# Patient Record
Sex: Male | Born: 1975 | Race: White | Hispanic: No | Marital: Married | Smoking: Former smoker
Health system: Southern US, Community
[De-identification: ages and names within clinical notes are randomized; demographics above are authoritative.]

## PROBLEM LIST (undated history)

## (undated) DIAGNOSIS — S46009A Unspecified injury of muscle(s) and tendon(s) of the rotator cuff of unspecified shoulder, initial encounter: Secondary | ICD-10-CM

## (undated) DIAGNOSIS — D649 Anemia, unspecified: Secondary | ICD-10-CM

## (undated) DIAGNOSIS — E785 Hyperlipidemia, unspecified: Secondary | ICD-10-CM

## (undated) DIAGNOSIS — R55 Syncope and collapse: Secondary | ICD-10-CM

## (undated) DIAGNOSIS — K429 Umbilical hernia without obstruction or gangrene: Secondary | ICD-10-CM

## (undated) HISTORY — DX: Unspecified injury of muscle(s) and tendon(s) of the rotator cuff of unspecified shoulder, initial encounter: S46.009A

## (undated) HISTORY — DX: Anemia, unspecified: D64.9

## (undated) HISTORY — PX: REFRACTIVE SURGERY: SHX103

## (undated) HISTORY — DX: Hyperlipidemia, unspecified: E78.5

## (undated) HISTORY — DX: Umbilical hernia without obstruction or gangrene: K42.9

---
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
## 2010-04-16 ENCOUNTER — Ambulatory Visit: Payer: Self-pay | Admitting: Internal Medicine

## 2010-04-30 DIAGNOSIS — M719 Bursopathy, unspecified: Secondary | ICD-10-CM

## 2010-04-30 DIAGNOSIS — M67919 Unspecified disorder of synovium and tendon, unspecified shoulder: Secondary | ICD-10-CM | POA: Insufficient documentation

## 2010-04-30 DIAGNOSIS — E785 Hyperlipidemia, unspecified: Secondary | ICD-10-CM | POA: Insufficient documentation

## 2010-07-10 ENCOUNTER — Ambulatory Visit: Payer: Self-pay | Admitting: Internal Medicine

## 2010-07-10 LAB — CONVERTED CEMR LAB
Albumin: 4.7 g/dL (ref 3.5–5.2)
BUN: 18 mg/dL (ref 6–23)
Basophils Relative: 0.5 % (ref 0.0–3.0)
Bilirubin Urine: NEGATIVE
Bilirubin, Direct: 0.2 mg/dL (ref 0.0–0.3)
CO2: 28 meq/L (ref 19–32)
Chloride: 104 meq/L (ref 96–112)
Cholesterol: 192 mg/dL (ref 0–200)
Creatinine, Ser: 1.3 mg/dL (ref 0.4–1.5)
Eosinophils Absolute: 0.2 10*3/uL (ref 0.0–0.7)
Glucose, Urine, Semiquant: NEGATIVE
Ketones, urine, test strip: NEGATIVE
MCHC: 35.3 g/dL (ref 30.0–36.0)
MCV: 89.5 fL (ref 78.0–100.0)
Monocytes Absolute: 0.4 10*3/uL (ref 0.1–1.0)
Neutrophils Relative %: 53.4 % (ref 43.0–77.0)
RBC: 4.38 M/uL (ref 4.22–5.81)
Specific Gravity, Urine: 1.01
TSH: 3.62 microintl units/mL (ref 0.35–5.50)
Total CHOL/HDL Ratio: 5
Total Protein: 7.5 g/dL (ref 6.0–8.3)
Triglycerides: 143 mg/dL (ref 0.0–149.0)
pH: 6

## 2010-07-21 ENCOUNTER — Encounter: Payer: Self-pay | Admitting: Internal Medicine

## 2010-07-21 ENCOUNTER — Ambulatory Visit: Payer: Self-pay | Admitting: Internal Medicine

## 2010-09-01 NOTE — Assessment & Plan Note (Signed)
Summary: TO BE EST/NJR OK PER DOC/NJR   Vital Signs:  Patient profile:   35 year old male Height:      73.5 inches Weight:      216 pounds BMI:     28.21 Temp:     98.2 degrees F oral Pulse rate:   60 / minute Resp:     14 per minute BP sitting:   110 / 70  (left arm)  Vitals Entered By: Willy Eddy, LPN (April 16, 2010 11:05 AM) CC: new patient Is Patient Diabetic? No   CC:  new patient.  History of Present Illness: shoulder pain in right rotator cuff  Has pain in the left butock that sometime radiates to the left thigh... some times goes past the knee has felt some back pain with work outs  new to establish and has significant familu hx of hyperlipidemia has not had lipids checked recentlhy has lost 20 pounds through diet and exercize    Preventive Screening-Counseling & Management  Alcohol-Tobacco     Alcohol drinks/day: <1     Smoking Status: quit     Year Quit: 2007     Tobacco Counseling: to remain off tobacco products  Caffeine-Diet-Exercise     Caffeine use/day: no     Does Patient Exercise: yes     Type of exercise: gym daily     Times/week: 7     Exercise Counseling: not indicated; exercise is adequate      Drug Use:  no.    Problems Prior to Update: None  Current Problems (verified): None  Medications Prior to Update: 1)  None  Current Medications (verified): 1)  None  Allergies (verified): No Known Drug Allergies  Past History:  Family History: Last updated: 04/16/2010 Family History Hypertension Family History High cholesterol no family hx of anyursm  Social History: Last updated: 04/16/2010 Occupation:social workerl  Single   Alcohol use-yes Drug use-no Regular exercise-yes  Risk Factors: Alcohol Use: <1 (04/16/2010) Caffeine Use: no (04/16/2010) Exercise: yes (04/16/2010)  Risk Factors: Smoking Status: quit (04/16/2010)  Past medical, surgical, family and social histories (including risk factors)  reviewed, and no changes noted (except as noted below).  Past Medical History: Unremarkable rotator cuff injusry on the right ( no surgery)  Past Surgical History: Appendectomy 1995  Family History: Reviewed history and no changes required. Family History Hypertension Family History High cholesterol no family hx of anyursm  Social History: Reviewed history and no changes required. Occupation:social workerl  Single   Alcohol use-yes Drug use-no Regular exercise-yes Drug Use:  no Does Patient Exercise:  yes Smoking Status:  quit Caffeine use/day:  no  Review of Systems  The patient denies anorexia, fever, weight loss, weight gain, vision loss, decreased hearing, hoarseness, chest pain, syncope, dyspnea on exertion, peripheral edema, prolonged cough, headaches, hemoptysis, abdominal pain, melena, hematochezia, severe indigestion/heartburn, hematuria, incontinence, genital sores, muscle weakness, suspicious skin lesions, transient blindness, difficulty walking, depression, unusual weight change, abnormal bleeding, enlarged lymph nodes, angioedema, breast masses, and testicular masses.    Physical Exam  General:  Well-developed,well-nourished,in no acute distress; alert,appropriate and cooperative throughout examination Head:  normocephalic and atraumatic.   Eyes:  pupils equal and pupils round.   Ears:  R ear normal and L ear normal.   Nose:  no external deformity and no nasal discharge.   Neck:  No deformities, masses, or tenderness noted. Chest Wall:  no deformities and no tenderness.   Breasts:  no gynecomastia and no adenopathy.  Lungs:  no wheezes.   Heart:  normal rate and regular rhythm.   Psych:  Oriented X3 and normally interactive.     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   cpx   Immunization History:  Tetanus/Td Immunization History:    Tetanus/Td:  historical (03/01/2006)  Hepatitis B Immunization History:    Hepatitis B # 1:  historical (07/02/2006)    Hepatitis B # 2:  historical (09/30/2005)    Hepatitis B # 3:  historical (04/08/2007)

## 2010-09-03 NOTE — Assessment & Plan Note (Signed)
Summary: CPX//ALP/PT RSC FROM BMP/OK PER DR/CJR   Vital Signs:  Patient profile:   35 year old male Height:      73.5 inches Weight:      224 pounds BMI:     29.26 Temp:     98.2 degrees F oral Pulse rate:   56 / minute Resp:     14 per minute BP sitting:   124 / 76  (left arm)  Vitals Entered By: Willy Eddy, LPN (July 21, 2010 3:49 PM) CC: cpx Is Patient Diabetic? No   Primary Care Shauntay Brunelli:  Stacie Glaze MD  CC:  cpx.  History of Present Illness: The pt was asked about all immunizations, health maint. 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) 2)  Rotator Cuff Syndrome, Right  (ICD-726.10)  Medications Prior to Update: 1)  None  Current Medications (verified): 1)  None  Allergies (verified): No Known Drug Allergies  Past History:  Family History: Last updated: 04/16/2010 Family History Hypertension Family History High cholesterol no family hx of anyursm  Social History: Last updated: 04/16/2010 Occupation:social workerl  Single   Alcohol use-yes Drug use-no Regular exercise-yes  Risk Factors: Alcohol Use: <1 (07/21/2010) Caffeine Use: no (04/16/2010) Exercise: yes (04/16/2010)  Risk Factors: Smoking Status: quit (07/21/2010)  Past medical, surgical, family and social histories (including risk factors) reviewed, and no changes noted (except as noted below).  Past Medical History: Reviewed history from 04/16/2010 and no changes  required. Unremarkable rotator cuff injusry on the right ( no surgery)  Past Surgical History: Reviewed history from 04/16/2010 and no changes required. Appendectomy 1995  Family History: Reviewed history from 04/16/2010 and no changes required. Family History Hypertension Family History High cholesterol no family hx of anyursm  Social History: Reviewed history from 04/16/2010 and no changes required. Occupation:social workerl  Single   Alcohol use-yes Drug use-no Regular exercise-yes  Review of Systems  The patient denies anorexia, fever, weight loss, weight gain, vision loss, decreased hearing, hoarseness, chest pain, syncope, dyspnea on exertion, peripheral edema, prolonged cough, headaches, hemoptysis, abdominal pain, melena, hematochezia, severe indigestion/heartburn, hematuria, incontinence, genital sores, muscle weakness, suspicious skin lesions, transient blindness, difficulty walking, depression, unusual weight change, abnormal bleeding, enlarged lymph nodes, angioedema, breast masses, and testicular masses.    Physical Exam  General:  Well-developed,well-nourished,in no acute distress; alert,appropriate and cooperative throughout examination Head:  normocephalic and atraumatic.   Eyes:  pupils equal and pupils round.   Ears:  R ear normal and L ear normal.   Nose:  no external deformity and no nasal discharge.   Neck:  No deformities, masses, or tenderness noted. Lungs:  no wheezes.   Heart:  normal rate and regular rhythm.   Abdomen:  soft, non-tender, normal bowel sounds, and no distention.   Rectal:  no external abnormalities and no masses.   Genitalia:  circumcised and no urethral discharge.   Prostate:  no gland enlargement and no asymmetry.   Pulses:  R and  L carotid,radial,femoral,dorsalis pedis and posterior tibial pulses are full and equal bilaterally Extremities:  No clubbing, cyanosis, edema, or deformity noted with normal full range of motion of all  joints.   Neurologic:  No cranial nerve deficits noted. Station and gait are normal. Plantar reflexes are down-going bilaterally. DTRs are symmetrical throughout. Sensory, motor and coordinative functions appear intact.   Impression & Recommendations:  Problem # 1:  HEALTH MAINTENANCE EXAM (ICD-V70.0) The pt was asked about all immunizations, health maint. services that are appropriate to their age and was given guidance on diet exercize  and weight management  Orders: EKG w/ Interpretation (93000)  Td Booster: Historical (03/01/2006)   Chol: 192 (07/10/2010)   HDL: 39.80 (07/10/2010)   LDL: 124 (07/10/2010)   TG: 143.0 (07/10/2010) TSH: 3.62 (07/10/2010)    Discussed using sunscreen, use of alcohol, drug use, self testicular exam, routine dental care, routine eye care, routine physical exam, seat belts, multiple vitamins, osteoporosis prevention, adequate calcium intake in diet, and recommendations for immunizations.  Discussed exercise and checking cholesterol.  Discussed gun safety, safe sex, and contraception. Also recommend checking PSA.  Patient Instructions: 1)  Please schedule a follow-up appointment in 1 year. for cpx   Orders Added: 1)  EKG w/ Interpretation [93000] 2)  Est. Patient 18-39 years [11914]

## 2011-08-05 ENCOUNTER — Other Ambulatory Visit (INDEPENDENT_AMBULATORY_CARE_PROVIDER_SITE_OTHER): Payer: BC Managed Care – PPO

## 2011-08-05 DIAGNOSIS — Z0000 Encounter for general adult medical examination without abnormal findings: Secondary | ICD-10-CM

## 2011-08-05 LAB — BASIC METABOLIC PANEL
GFR: 71.9 mL/min (ref >60.00)
Glucose, Bld: 81 mg/dL (ref 70–99)
Potassium: 3.9 mEq/L (ref 3.5–5.1)
Sodium: 141 mEq/L (ref 135–145)

## 2011-08-05 LAB — CBC WITH DIFFERENTIAL/PLATELET
Eosinophils Relative: 3.6 % (ref 0.0–5.0)
HCT: 38.1 % — ABNORMAL LOW (ref 39.0–52.0)
Hemoglobin: 13.4 g/dL (ref 13.0–17.0)
Lymphs Abs: 1.7 10*3/uL (ref 0.7–4.0)
Monocytes Relative: 8.1 % (ref 3.0–12.0)
Neutro Abs: 2.7 10*3/uL (ref 1.4–7.7)
RBC: 4.3 Mil/uL (ref 4.22–5.81)
WBC: 5 10*3/uL (ref 4.5–10.5)

## 2011-08-05 LAB — POCT URINALYSIS DIPSTICK
Bilirubin, UA: NEGATIVE
Blood, UA: NEGATIVE
Glucose, UA: NEGATIVE
Nitrite, UA: NEGATIVE
Spec Grav, UA: <=1.005

## 2011-08-05 LAB — HEPATIC FUNCTION PANEL
ALT: 56 U/L — ABNORMAL HIGH (ref 0–53)
AST: 56 U/L — ABNORMAL HIGH (ref 0–37)
Albumin: 4.6 g/dL (ref 3.5–5.2)
Alkaline Phosphatase: 59 U/L (ref 39–117)

## 2011-08-05 LAB — TSH: TSH: 3.29 u[IU]/mL (ref 0.35–5.50)

## 2011-08-05 LAB — LIPID PANEL
Cholesterol: 224 mg/dL — ABNORMAL HIGH (ref 0–200)
Total CHOL/HDL Ratio: 5

## 2011-08-05 LAB — LDL CHOLESTEROL, DIRECT: Direct LDL: 141 mg/dL

## 2011-08-12 ENCOUNTER — Encounter: Payer: Self-pay | Admitting: Internal Medicine

## 2011-08-12 ENCOUNTER — Ambulatory Visit (INDEPENDENT_AMBULATORY_CARE_PROVIDER_SITE_OTHER): Payer: BC Managed Care – PPO | Admitting: Internal Medicine

## 2011-08-12 DIAGNOSIS — M25522 Pain in left elbow: Secondary | ICD-10-CM

## 2011-08-12 DIAGNOSIS — M25529 Pain in unspecified elbow: Secondary | ICD-10-CM

## 2011-08-12 DIAGNOSIS — E785 Hyperlipidemia, unspecified: Secondary | ICD-10-CM

## 2011-08-12 DIAGNOSIS — Z0000 Encounter for general adult medical examination without abnormal findings: Secondary | ICD-10-CM

## 2011-08-12 NOTE — Progress Notes (Signed)
Subjective:    Patient ID: Thomas Gilmore, male    DOB: [birth year], 36 y.o.   MRN: [id number]  HPI Weight and exercize Muscle gain in mass Cardio is the same Has been eating an increased caloric diet due to his desire to gain weight and this has resulted in an imbalance of his cholesterol and elevated liver functions.  Patient also has an acute complaint of pain in his left elbow he states that it was after he fell asleep with his an extended position and he still has some discomfort but feels deep into the central elbow and not involving the epicondyles.     Review of Systems  Constitutional: Negative for fever and fatigue.  HENT: Negative for hearing loss, congestion, neck pain and postnasal drip.   Eyes: Negative for discharge, redness and visual disturbance.  Respiratory: Negative for cough, shortness of breath and wheezing.   Cardiovascular: Negative for leg swelling.  Gastrointestinal: Negative for abdominal pain, constipation and abdominal distention.  Genitourinary: Negative for urgency and frequency.  Musculoskeletal: Positive for joint swelling and arthralgias.  Skin: Negative for color change and rash.  Neurological: Negative for weakness and light-headedness.  Hematological: Negative for adenopathy.  Psychiatric/Behavioral: Negative for behavioral problems.       Past Medical History  Diagnosis Date  . Rotator cuff injury     History   Social History  . Marital Status: Single    Spouse Name: N/A    Number of Children: N/A  . Years of Education: N/A   Occupational History  . Not on file.   Social History Main Topics  . Smoking status: Former Smoker -- 1.0 packs/day for 11 years    Quit date: 08/12/2007  . Smokeless tobacco: Never Used  . Alcohol Use: Yes  . Drug Use: No  . Sexually Active: Not on file   Other Topics Concern  . Not on file   Social History Narrative  . No narrative on file    Past Surgical History  Procedure Date  . Appendectomy      Family History  Problem Relation Age of Onset  . Hypertension    . Hyperlipidemia    . Anuerysm      No Known Allergies  No current outpatient prescriptions on file prior to visit.    BP 136/80  Pulse 60  Temp 98.2 F (36.8 C)  Resp 14  Ht 6\' 1"  (1.854 m)  Wt 232 lb (105.235 kg)  BMI 30.61 kg/m2    Objective:   Physical Exam  Nursing note and vitals reviewed. Constitutional: He is oriented to person, place, and time. He appears well-developed and well-nourished.  HENT:  Head: Normocephalic and atraumatic.  Eyes: Conjunctivae are normal. Pupils are equal, round, and reactive to light.  Neck: Normal range of motion. Neck supple.  Cardiovascular: Normal rate and regular rhythm.   Pulmonary/Chest: Effort normal and breath sounds normal.  Abdominal: Soft. Bowel sounds are normal.  Genitourinary: Prostate normal.  Musculoskeletal: Normal range of motion. He exhibits tenderness.       Deep tenderness in the fossa of the left elbow consistent with some persistent elbow tendinitis  Neurological: He is alert and oriented to person, place, and time.  Skin: Skin is warm and dry.  Psychiatric: He has a normal mood and affect. His behavior is normal.          Assessment & Plan:   Patient presents for yearly preventative medicine examination.   all immunizations and health maintenance protocols  were reviewed with the patient and they are up to date with these protocols.   screening laboratory values were reviewed with the patient including screening of hyperlipidemia PSA renal function and hepatic function.   There medications past medical history social history problem list and allergies were reviewed in detail.   Goals were established with regard to weight loss exercise diet in compliance with medications  Patient has familial type hyperlipidemia with elevated triglycerides and increased liver function suggesting fatty infiltration of his liver we treat his family  therefore we are aware of the family history of hepatic steatorrhea.  We have suggested that he needs to modify his weight by changing his activity from less bodybuilding 2 more focused on aerobic activity and moderate his caloric intake decreasing protein supplements which may be affecting the imbalance.  These protein supplements may also be affecting the elevated liver functions as they can cause gallbladder sludge this was explained to the patient the patient expressed understanding.  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

## 2011-08-12 NOTE — Patient Instructions (Signed)
You need to follow the following recommendations.  Increase aerobic activity Decreased caloric intake Modify weight by aerobic activity Minimize use of protein supplements such as protein shakes and protein bars Use sources of bleeding protein in your diet Take the nonsteroidal samples for your elbow pain if it persists over 2 weeks call her office for referral to orthopedist

## 2011-11-04 ENCOUNTER — Encounter: Payer: Self-pay | Admitting: Family

## 2011-11-04 ENCOUNTER — Ambulatory Visit (INDEPENDENT_AMBULATORY_CARE_PROVIDER_SITE_OTHER): Payer: BC Managed Care – PPO | Admitting: Family

## 2011-11-04 VITALS — BP 120/80 | Temp 97.9°F | Wt 237.0 lb

## 2011-11-04 DIAGNOSIS — K4090 Unilateral inguinal hernia, without obstruction or gangrene, not specified as recurrent: Secondary | ICD-10-CM

## 2011-11-04 NOTE — Progress Notes (Signed)
  Subjective:    Patient ID: Thomas Gilmore, male    DOB: 16-Jul-1976, 36 y.o.   MRN: [id number]  HPI 36 year old white male, nonsmoker patient of Dr. Lovell Sheehan within today with complaints of a bulging in the right groin is to present for about a week. Patient reports heavy lifting and working out more recently. He denies any pain. No pain with coughing or sneezing. He does has minimal discomfort with pressure to the area. Denies any urinary frequency, urgency, burning with urination, blood in urine. No concerns of any sexually-transmitted diseases.   Review of Systems  Constitutional: Negative.   Respiratory: Negative.   Gastrointestinal: Negative.   Genitourinary: Negative for scrotal swelling and testicular pain.       Bulge to the right groin  Neurological: Negative.   Psychiatric/Behavioral: Negative.    Past Medical History  Diagnosis Date  . Rotator cuff injury     History   Social History  . Marital Status: Single    Spouse Name: N/A    Number of Children: N/A  . Years of Education: N/A   Occupational History  . Not on file.   Social History Main Topics  . Smoking status: Former Smoker -- 1.0 packs/day for 11 years    Quit date: 08/12/2007  . Smokeless tobacco: Never Used  . Alcohol Use: Yes  . Drug Use: No  . Sexually Active: Not on file   Other Topics Concern  . Not on file   Social History Narrative  . No narrative on file    Past Surgical History  Procedure Date  . Appendectomy     Family History  Problem Relation Age of Onset  . Hypertension    . Hyperlipidemia    . Anuerysm      No Known Allergies  No current outpatient prescriptions on file prior to visit.    BP 120/80  Temp(Src) 97.9 F (36.6 C) (Oral)  Wt 237 lb (107.502 kg)chart    Objective:   Physical Exam  Constitutional: He is oriented to person, place, and time. He appears well-developed and well-nourished.  Cardiovascular: Normal rate, regular rhythm and normal heart sounds.     Pulmonary/Chest: Effort normal and breath sounds normal.  Abdominal: A hernia is present. Hernia confirmed positive in the right inguinal area.  Genitourinary: Penis normal.    Right testis shows mass.  Neurological: He is alert and oriented to person, place, and time.  Skin: Skin is warm and dry.          Assessment & Plan:  Assessment: Right inguinal hernia  Plan: No heavy lifting or bending. Refer patient to surgeon for further management. Call the office if symptoms worsen or persist. Discussed signs of strangulation. Recheck as scheduled and when necessary.

## 2011-11-04 NOTE — Patient Instructions (Signed)
Inguinal Hernia, Adult  Muscles help keep everything in the body in its proper place. But if a weak spot in the muscles develops, something can poke through. That is called a hernia. When this happens in the lower part of the belly (abdomen), it is called an inguinal hernia. (It takes its name from a part of the body in this region called the inguinal canal.) A weak spot in the wall of muscles lets some fat or part of the small intestine bulge through. An inguinal hernia can develop at any age. Men get them more often than women.  CAUSES   In adults, an inguinal hernia develops over time.  · It can be triggered by:  · Suddenly straining the muscles of the lower abdomen.  · Lifting heavy objects.  · Straining to have a bowel movement. Difficult bowel movements (constipation) can lead to this.  · Constant coughing. This may be caused by smoking or lung disease.  · Being overweight.  · Being pregnant.  · Working at a job that requires long periods of standing or heavy lifting.  · Having had an inguinal hernia before.  One type can be an emergency situation. It is called a strangulated inguinal hernia. It develops if part of the small intestine slips through the weak spot and cannot get back into the abdomen. The blood supply can be cut off. If that happens, part of the intestine may die. This situation requires emergency surgery.  SYMPTOMS   Often, a small inguinal hernia has no symptoms. It is found when a healthcare provider does a physical exam. Larger hernias usually have symptoms.   · In adults, symptoms may include:  · A lump in the groin. This is easier to see when the person is standing. It might disappear when lying down.  · In men, a lump in the scrotum.  · Pain or burning in the groin. This occurs especially when lifting, straining or coughing.  · A dull ache or feeling of pressure in the groin.  · Signs of a strangulated hernia can include:  · A bulge in the groin that becomes very painful and tender to the  touch.  · A bulge that turns red or purple.  · Fever, nausea and vomiting.  · Inability to have a bowel movement or to pass gas.  DIAGNOSIS   To decide if you have an inguinal hernia, a healthcare provider will probably do a physical examination.  · This will include asking questions about any symptoms you have noticed.  · The healthcare provider might feel the groin area and ask you to cough. If an inguinal hernia is felt, the healthcare provider may try to slide it back into the abdomen.  · Usually no other tests are needed.  TREATMENT   Treatments can vary. The size of the hernia makes a difference. Options include:  · Watchful waiting. This is often suggested if the hernia is small and you have had no symptoms.  · No medical procedure will be done unless symptoms develop.  · You will need to watch closely for symptoms. If any occur, contact your healthcare provider right away.  · Surgery. This is used if the hernia is larger or you have symptoms.  · Open surgery. This is usually an outpatient procedure (you will not stay overnight in a hospital). An cut (incision) is made through the skin in the groin. The hernia is put back inside the abdomen. The weak area in the muscles is   then repaired by herniorrhaphy or hernioplasty. Herniorrhaphy: in this type of surgery, the weak muscles are sewn back together. Hernioplasty: a patch or mesh is used to close the weak area in the abdominal wall.  · Laparoscopy. In this procedure, a surgeon makes small incisions. A thin tube with a tiny video camera (called a laparoscope) is put into the abdomen. The surgeon repairs the hernia with mesh by looking with the video camera and using two long instruments.  HOME CARE INSTRUCTIONS   · After surgery to repair an inguinal hernia:  · You will need to take pain medicine prescribed by your healthcare provider. Follow all directions carefully.  · You will need to take care of the wound from the incision.  · Your activity will be  restricted for awhile. This will probably include no heavy lifting for several weeks. You also should not do anything too active for a few weeks. When you can return to work will depend on the type of job that you have.  · During "watchful waiting" periods, you should:  · Maintain a healthy weight.  · Eat a diet high in fiber (fruits, vegetables and whole grains).  · Drink plenty of fluids to avoid constipation. This means drinking enough water and other liquids to keep your urine clear or pale yellow.  · Do not lift heavy objects.  · Do not stand for long periods of time.  · Quit smoking. This should keep you from developing a frequent cough.  SEEK MEDICAL CARE IF:   · A bulge develops in your groin area.  · You feel pain, a burning sensation or pressure in the groin. This might be worse if you are lifting or straining.  · You develop a fever of more than 100.5° F (38.1° C).  SEEK IMMEDIATE MEDICAL CARE IF:   · Pain in the groin increases suddenly.  · A bulge in the groin gets bigger suddenly and does not go down.  · For men, there is sudden pain in the scrotum. Or, the size of the scrotum increases.  · A bulge in the groin area becomes red or purple and is painful to touch.  · You have nausea or vomiting that does not go away.  · You feel your heart beating much faster than normal.  · You cannot have a bowel movement or pass gas.  · You develop a fever of more than 102.0° F (38.9° C).  Document Released: 12/05/2008 Document Revised: 07/08/2011 Document Reviewed: 12/05/2008  [employer]® Patient Information ©2012 [employer].

## 2011-11-15 ENCOUNTER — Other Ambulatory Visit: Payer: BC Managed Care – PPO

## 2011-11-18 ENCOUNTER — Encounter (INDEPENDENT_AMBULATORY_CARE_PROVIDER_SITE_OTHER): Payer: Self-pay | Admitting: General Surgery

## 2011-11-18 ENCOUNTER — Encounter (INDEPENDENT_AMBULATORY_CARE_PROVIDER_SITE_OTHER): Payer: Self-pay

## 2011-11-22 ENCOUNTER — Ambulatory Visit: Payer: BC Managed Care – PPO | Admitting: Internal Medicine

## 2011-11-29 ENCOUNTER — Encounter (INDEPENDENT_AMBULATORY_CARE_PROVIDER_SITE_OTHER): Payer: Self-pay | Admitting: General Surgery

## 2011-11-29 ENCOUNTER — Ambulatory Visit (INDEPENDENT_AMBULATORY_CARE_PROVIDER_SITE_OTHER): Payer: BC Managed Care – PPO | Admitting: General Surgery

## 2011-11-29 VITALS — BP 126/82 | HR 70 | Temp 97.4°F | Resp 16 | Ht 73.5 in | Wt 229.4 lb

## 2011-11-29 DIAGNOSIS — K4090 Unilateral inguinal hernia, without obstruction or gangrene, not specified as recurrent: Secondary | ICD-10-CM | POA: Insufficient documentation

## 2011-11-29 NOTE — Progress Notes (Signed)
Patient ID: Thomas Gilmore, male   DOB: May 27, 1976, 35 y.o.   MRN: [id number]  Chief Complaint  Patient presents with  . Umbilical Hernia  . Inguinal Hernia    HPI Thomas Gilmore is a 36 y.o. male.  He is referred by Dr. Darryll Capers and Adline Mango, FNP at [facility] for evaluation of a right inguinal hernia.  The patient has never had a hernia problem before. He did have an open appendectomy at age 27 througha large right lower quadrant incision. He has a two-month history of a bulge in his right groin. Minimally uncomfortable, and he feels pressure when he is at the gym. Always reducible.  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 He knows that he would have to refrain for that for one month postop. HPI  Past Medical History  Diagnosis Date  . Rotator cuff injury   . Hyperlipidemia   . Inguinal hernia   . Umbilical hernia     Past Surgical History  Procedure Date  . Appendectomy 12/1993    Family History  Problem Relation Age of Onset  . Hypertension    . Hyperlipidemia    . Anuerysm      Social History History  Substance Use Topics  . Smoking status: Former Smoker -- 1.0 packs/day for 11 years    Types: Cigarettes    Quit date: 06/03/2007  . Smokeless tobacco: Never Used  . Alcohol Use: Yes     4 per month    No Known Allergies  No current outpatient prescriptions on file.    Review of Systems Review of Systems  Constitutional: Negative for fever, chills and unexpected weight change.  HENT: Negative for hearing loss, congestion, sore throat, trouble swallowing and voice change.   Eyes: Negative for visual disturbance.  Respiratory: Negative for cough and wheezing.   Cardiovascular: Negative for chest pain, palpitations and leg swelling.  Gastrointestinal: Negative for nausea, vomiting, abdominal pain, diarrhea, constipation, blood in stool, abdominal distention, anal bleeding and rectal pain.  Genitourinary: Negative for  hematuria and difficulty urinating.  Musculoskeletal: Negative for arthralgias.  Skin: Negative for rash and wound.  Neurological: Negative for seizures, syncope, weakness and headaches.  Hematological: Negative for adenopathy. Does not bruise/bleed easily.  Psychiatric/Behavioral: Negative for confusion.    Blood pressure 126/82, pulse 70, temperature 97.4 F (36.3 C), temperature source Temporal, resp. rate 16, height 6' 1.5" (1.867 m), weight 229 lb 6 oz (104.044 kg).  Physical Exam Physical Exam  Constitutional: He is oriented to person, place, and time. He appears well-developed and well-nourished. No distress.  HENT:  Head: Normocephalic.  Nose: Nose normal.  Mouth/Throat: No oropharyngeal exudate.  Eyes: Conjunctivae and EOM are normal. Pupils are equal, round, and reactive to light. Right eye exhibits no discharge. Left eye exhibits no discharge. No scleral icterus.  Neck: Normal range of motion. Neck supple. No JVD present. No tracheal deviation present. No thyromegaly present.  Cardiovascular: Normal rate, regular rhythm, normal heart sounds and intact distal pulses.   No murmur heard. Pulmonary/Chest: Effort normal and breath sounds normal. No stridor. No respiratory distress. He has no wheezes. He has no rales. He exhibits no tenderness.  Abdominal: Soft. Bowel sounds are normal. He exhibits no distension and no mass. There is no tenderness. There is no rebound and no guarding.       Large right lower quadrant scar, well healed.  Genitourinary:       Medium-sized right inguinal hernia; does not  extend into the scrotum, but might be indirect type.  No scrotal or testicular mass.  Musculoskeletal: Normal range of motion. He exhibits no edema and no tenderness.  Lymphadenopathy:    He has no cervical adenopathy.  Neurological: He is alert and oriented to person, place, and time. He has normal reflexes. Coordination normal.  Skin: Skin is warm and dry. No rash noted. He is not  diaphoretic. No erythema. No pallor.  Psychiatric: He has a normal mood and affect. His behavior is normal. Judgment and thought content normal.    Data Reviewed I have reviewed the office notes from [place] at [place].  Assessment    Reducible right inguinal hernia    Plan    Scheduled for repair of right inguinal hernia with mesh. We discussed open and laparoscopic techniques. Together we have chosen the open technique because of his large right lower quadrant scar and the simplicity of the approach.  I discussed the indications and details and techniques of the surgery. I have discussed risks and detail, including but not limited to bleeding, infection, recurrence of the hernia, nerve damage chronic pain, injury to the bladder,ntestine, and other unforeseen problems. He understands his issues. His questions were answered. He agrees with this plan.       Angelia Mould. Derrell Lolling, M.D., [facility] General and Minimally invasive Surgery Breast and Colorectal Surgery Office:   [phone] Pager:   [phone] si4/29/2013, 3:24 PM

## 2011-11-29 NOTE — Patient Instructions (Signed)
You have a reducible right inguinal hernia. I do not feel any other hernias. You will be scheduled for surgery to repair your right inguinal hernia with mesh.     Inguinal Hernia, Adult Muscles help keep everything in the body in its proper place. But if a weak spot in the muscles develops, something can poke through. That is called a hernia. When this happens in the lower part of the belly (abdomen), it is called an inguinal hernia. (It takes its name from a part of the body in this region called the inguinal canal.) A weak spot in the wall of muscles lets some fat or part of the small intestine bulge through. An inguinal hernia can develop at any age. Men get them more often than women. CAUSES  In adults, an inguinal hernia develops over time.  It can be triggered by:   Suddenly straining the muscles of the lower abdomen.   Lifting heavy objects.   Straining to have a bowel movement. Difficult bowel movements (constipation) can lead to this.   Constant coughing. This may be caused by smoking or lung disease.   Being overweight.   Being pregnant.   Working at a job that requires long periods of standing or heavy lifting.   Having had an inguinal hernia before.  One type can be an emergency situation. It is called a strangulated inguinal hernia. It develops if part of the small intestine slips through the weak spot and cannot get back into the abdomen. The blood supply can be cut off. If that happens, part of the intestine may die. This situation requires emergency surgery. SYMPTOMS  Often, a small inguinal hernia has no symptoms. It is found when a healthcare provider does a physical exam. Larger hernias usually have symptoms.   In adults, symptoms may include:   A lump in the groin. This is easier to see when the person is standing. It might disappear when lying down.   In men, a lump in the scrotum.   Pain or burning in the groin. This occurs especially when lifting, straining  or coughing.   A dull ache or feeling of pressure in the groin.   Signs of a strangulated hernia can include:   A bulge in the groin that becomes very painful and tender to the touch.   A bulge that turns red or purple.   Fever, nausea and vomiting.   Inability to have a bowel movement or to pass gas.  DIAGNOSIS  To decide if you have an inguinal hernia, a healthcare provider will probably do a physical examination.  This will include asking questions about any symptoms you have noticed.   The healthcare provider might feel the groin area and ask you to cough. If an inguinal hernia is felt, the healthcare provider may try to slide it back into the abdomen.   Usually no other tests are needed.  TREATMENT  Treatments can vary. The size of the hernia makes a difference. Options include:  Watchful waiting. This is often suggested if the hernia is small and you have had no symptoms.   No medical procedure will be done unless symptoms develop.   You will need to watch closely for symptoms. If any occur, contact your healthcare provider right away.   Surgery. This is used if the hernia is larger or you have symptoms.   Open surgery. This is usually an outpatient procedure (you will not stay overnight in a hospital). An cut (incision) is made  through the skin in the groin. The hernia is put back inside the abdomen. The weak area in the muscles is then repaired by herniorrhaphy or hernioplasty. Herniorrhaphy: in this type of surgery, the weak muscles are sewn back together. Hernioplasty: a patch or mesh is used to close the weak area in the abdominal wall.   Laparoscopy. In this procedure, a surgeon makes small incisions. A thin tube with a tiny video camera (called a laparoscope) is put into the abdomen. The surgeon repairs the hernia with mesh by looking with the video camera and using two long instruments.  HOME CARE INSTRUCTIONS   After surgery to repair an inguinal hernia:   You  will need to take pain medicine prescribed by your healthcare provider. Follow all directions carefully.   You will need to take care of the wound from the incision.   Your activity will be restricted for awhile. This will probably include no heavy lifting for several weeks. You also should not do anything too active for a few weeks. When you can return to work will depend on the type of job that you have.   During "watchful waiting" periods, you should:   Maintain a healthy weight.   Eat a diet high in fiber (fruits, vegetables and whole grains).   Drink plenty of fluids to avoid constipation. This means drinking enough water and other liquids to keep your urine clear or pale yellow.   Do not lift heavy objects.   Do not stand for long periods of time.   Quit smoking. This should keep you from developing a frequent cough.  SEEK MEDICAL CARE IF:   A bulge develops in your groin area.   You feel pain, a burning sensation or pressure in the groin. This might be worse if you are lifting or straining.   You develop a fever of more than 100.5 F (38.1 C).  SEEK IMMEDIATE MEDICAL CARE IF:   Pain in the groin increases suddenly.   A bulge in the groin gets bigger suddenly and does not go down.   For men, there is sudden pain in the scrotum. Or, the size of the scrotum increases.   A bulge in the groin area becomes red or purple and is painful to touch.   You have nausea or vomiting that does not go away.   You feel your heart beating much faster than normal.   You cannot have a bowel movement or pass gas.   You develop a fever of more than 102.0 F (38.9 C).  Document Released: 12/05/2008 Document Revised: 07/08/2011 Document Reviewed: 12/05/2008 [facility] Patient Information 2012 [place], [place].

## 2011-12-06 DIAGNOSIS — K4090 Unilateral inguinal hernia, without obstruction or gangrene, not specified as recurrent: Secondary | ICD-10-CM

## 2011-12-06 HISTORY — PX: HERNIA REPAIR: SHX51

## 2011-12-07 ENCOUNTER — Telehealth: Payer: Self-pay | Admitting: Internal Medicine

## 2011-12-07 ENCOUNTER — Telehealth (INDEPENDENT_AMBULATORY_CARE_PROVIDER_SITE_OTHER): Payer: Self-pay | Admitting: General Surgery

## 2011-12-07 NOTE — Telephone Encounter (Signed)
Pt's fiancee calling to ask about pain meds.  Pt tripped and fell into the tub this morning.  Since then he has noted slightly more pain and is asking if OK to take 2 pain pills.  Instructed fiancee okay to take 2 Percocet this time, monitor his movements (to avoid another fall, especially) and try to drop back to only 1 at a time.

## 2011-12-07 NOTE — Telephone Encounter (Signed)
Pt called and said that he slipped and fell in tub and cut his arm. Pt said that he may need stitches. Pt was advised to go to ER per Rushie Goltz, Dr Lovell Sheehan nurse. Pt going to ER as recommended.

## 2011-12-13 ENCOUNTER — Telehealth (INDEPENDENT_AMBULATORY_CARE_PROVIDER_SITE_OTHER): Payer: Self-pay

## 2011-12-13 NOTE — Telephone Encounter (Signed)
Pt home doing well. PO appt made. 

## 2011-12-14 ENCOUNTER — Encounter (HOSPITAL_COMMUNITY): Payer: Self-pay | Admitting: *Deleted

## 2011-12-14 ENCOUNTER — Emergency Department (HOSPITAL_COMMUNITY): Payer: BC Managed Care – PPO

## 2011-12-14 ENCOUNTER — Inpatient Hospital Stay (HOSPITAL_COMMUNITY)
Admission: EM | Admit: 2011-12-14 | Discharge: 2011-12-17 | DRG: 494 | Disposition: A | Discharge status: Home / Self Care | Payer: BC Managed Care – PPO | Attending: General Surgery | Admitting: General Surgery

## 2011-12-14 DIAGNOSIS — R7989 Other specified abnormal findings of blood chemistry: Secondary | ICD-10-CM | POA: Diagnosis present

## 2011-12-14 DIAGNOSIS — K8000 Calculus of gallbladder with acute cholecystitis without obstruction: Principal | ICD-10-CM | POA: Diagnosis present

## 2011-12-14 DIAGNOSIS — R112 Nausea with vomiting, unspecified: Secondary | ICD-10-CM | POA: Diagnosis present

## 2011-12-14 DIAGNOSIS — N289 Disorder of kidney and ureter, unspecified: Secondary | ICD-10-CM | POA: Diagnosis present

## 2011-12-14 DIAGNOSIS — Z87891 Personal history of nicotine dependence: Secondary | ICD-10-CM

## 2011-12-14 DIAGNOSIS — Z9889 Other specified postprocedural states: Secondary | ICD-10-CM

## 2011-12-14 DIAGNOSIS — K819 Cholecystitis, unspecified: Secondary | ICD-10-CM

## 2011-12-14 DIAGNOSIS — R1011 Right upper quadrant pain: Secondary | ICD-10-CM | POA: Diagnosis present

## 2011-12-14 DIAGNOSIS — K8020 Calculus of gallbladder without cholecystitis without obstruction: Secondary | ICD-10-CM | POA: Diagnosis present

## 2011-12-14 DIAGNOSIS — E785 Hyperlipidemia, unspecified: Secondary | ICD-10-CM | POA: Diagnosis present

## 2011-12-14 HISTORY — DX: Syncope and collapse: R55

## 2011-12-14 LAB — CBC
HCT: 39.6 % (ref 39.0–52.0)
MCH: 30.3 pg (ref 26.0–34.0)
MCV: 82.2 fL (ref 78.0–100.0)
Platelets: 260 10*3/uL (ref 150–400)
RBC: 4.82 MIL/uL (ref 4.22–5.81)
WBC: 15.5 10*3/uL — ABNORMAL HIGH (ref 4.0–10.5)

## 2011-12-14 LAB — URINALYSIS, ROUTINE W REFLEX MICROSCOPIC
Bilirubin Urine: NEGATIVE
Leukocytes, UA: NEGATIVE
Nitrite: NEGATIVE
Specific Gravity, Urine: 1.021 (ref 1.005–1.030)
Urobilinogen, UA: 0.2 mg/dL (ref 0.0–1.0)
pH: 7.5 (ref 5.0–8.0)

## 2011-12-14 LAB — DIFFERENTIAL
Eosinophils Absolute: 0.2 10*3/uL (ref 0.0–0.7)
Eosinophils Relative: 1 % (ref 0–5)
Lymphocytes Relative: 4 % — ABNORMAL LOW (ref 12–46)
Lymphs Abs: 0.6 10*3/uL — ABNORMAL LOW (ref 0.7–4.0)
Monocytes Absolute: 0.8 10*3/uL (ref 0.1–1.0)
Monocytes Relative: 5 % (ref 3–12)

## 2011-12-14 MED ORDER — SODIUM CHLORIDE 0.9 % IV SOLN
1000.0000 mL | Freq: Once | INTRAVENOUS | Status: AC
  Administered 2011-12-14: 1000 mL via INTRAVENOUS

## 2011-12-14 MED ORDER — ONDANSETRON HCL 4 MG/2ML IJ SOLN
4.0000 mg | Freq: Once | INTRAMUSCULAR | Status: AC
  Administered 2011-12-14: 4 mg via INTRAVENOUS
  Filled 2011-12-14: qty 2

## 2011-12-14 MED ORDER — SODIUM CHLORIDE 0.9 % IV SOLN
1000.0000 mL | INTRAVENOUS | Status: DC
  Administered 2011-12-15: 1000 mL via INTRAVENOUS

## 2011-12-14 NOTE — ED Notes (Signed)
Pt states he had a hernia surgery 8 days ago. Pt states he black out and fell in the bathroom 7 days ago and has started to have abdominal pain and emesis. Pt denies any blood in emesis. Pt pain has increased

## 2011-12-14 NOTE — ED Provider Notes (Addendum)
History     CSN: [id number]  Arrival date & time 12/14/11  2129   First MD Initiated Contact with Patient 12/14/11 2237      Chief Complaint  Patient presents with  . Abdominal Pain  . Emesis  . Nausea    HPI Pt started having nausea and vomiting today.  Today he has had 5-6 loose stools.  He has also had two episodes of vomiting.  He is concerned because he had surgery to repair an inguinal hernia about 8 days ago.  He had a fall 1 day after the surgery but is not sure if he injured himself.  Pt has not had any fevers.  No cough or sore throat. Nothing seems to make it better.  His stomach is not really hurting now , it is just uncomfortable.  Past Medical History  Diagnosis Date  . Rotator cuff injury   . Hyperlipidemia   . Inguinal hernia   . Umbilical hernia     Past Surgical History  Procedure Date  . Appendectomy 12/1993  Hernia repair  Family History  Problem Relation Age of Onset  . Hypertension    . Hyperlipidemia    . Anuerysm      History  Substance Use Topics  . Smoking status: Former Smoker -- 1.0 packs/day for 11 years    Types: Cigarettes    Quit date: 06/03/2007  . Smokeless tobacco: Never Used  . Alcohol Use: Yes     4 per month      Review of Systems  All other systems reviewed and are negative.    Allergies  Review of patient's allergies indicates no known allergies.  Home Medications   Current Outpatient Rx  Name Route Sig Dispense Refill  . OXYCODONE-ACETAMINOPHEN 5-500 MG PO CAPS Oral Take 2 capsules by mouth every 4 (four) hours as needed.      BP 111/71  Pulse 76  Temp(Src) 98.8 F (37.1 C) (Oral)  Resp 18  SpO2 97%  Physical Exam  Nursing note and vitals reviewed. Constitutional: He appears well-developed and well-nourished. No distress.  HENT:  Head: Normocephalic and atraumatic.  Right Ear: External ear normal.  Left Ear: External ear normal.  Eyes: Conjunctivae are normal. Right eye exhibits no discharge. Left  eye exhibits no discharge. No scleral icterus.  Neck: Neck supple. No tracheal deviation present.  Cardiovascular: Normal rate, regular rhythm and intact distal pulses.   Pulmonary/Chest: Effort normal and breath sounds normal. No stridor. No respiratory distress. He has no wheezes. He has no rales.  Abdominal: Soft. Bowel sounds are normal. He exhibits no distension. There is no tenderness. There is no rebound and no guarding.       Right inguinal hernia scar, no e/e  Musculoskeletal: He exhibits no edema and no tenderness.  Neurological: He is alert. He has normal strength. No sensory deficit. Cranial nerve deficit:  no gross defecits noted. He exhibits normal muscle tone. He displays no seizure activity. Coordination normal.  Skin: Skin is warm and dry. No rash noted.  Psychiatric: He has a normal mood and affect.    ED Course  Procedures (including critical care time)  Labs Reviewed  CBC - Abnormal; Notable for the following:    WBC 15.5 (*)    MCHC 36.9 (*)    All other components within normal limits  DIFFERENTIAL - Abnormal; Notable for the following:    Neutrophils Relative 90 (*)    Neutro Abs 13.9 (*)    Lymphocytes  Relative 4 (*)    Lymphs Abs 0.6 (*)    All other components within normal limits  COMPREHENSIVE METABOLIC PANEL - Abnormal; Notable for the following:    Glucose, Bld 108 (*)    AST 220 (*)    ALT 254 (*)    GFR calc non Af Amer 76 (*)    GFR calc Af Amer 88 (*)    All other components within normal limits  URINALYSIS, ROUTINE W REFLEX MICROSCOPIC  LIPASE, BLOOD   Dg Abd Acute W/chest  12/14/2011  *RADIOLOGY REPORT*  Clinical Data: Abdominal pain.  Nausea.  Vomiting.  Recent right inguinal hernia repair.  ACUTE ABDOMEN SERIES (ABDOMEN 2 VIEW & CHEST 1 VIEW)  Comparison: None.  Findings: Cardiac and mediastinal contours appear normal.  The lungs appear clear.  No pleural effusion is identified.  No free intraperitoneal gas noted.  Air-fluid levels are  present in the ascending and transverse colon.  No dilated bowel noted.  The small bowel is predominately gasless.  No significant abnormal calcific densities noted.  IMPRESSION: 1.  No significant abnormal findings to account the patient's abdominal pain, nausea, and vomiting.  Original Report Authenticated By: Dellia Cloud, M.D.     No diagnosis found.    MDM  Pt with increased LFTs and leukocytosis.  Doubt bowel obstruction based on exam and xray findings.  At this point he is feeling better.  Only mild ttp in ruq.  Will check [place] to evaluate further.  Cases turned over to Dr Dierdre Highman to dispo accordingly.          Celene Kras, MD 12/15/11 0019   Ultrasound reviewed as below. I evaluated patient and he remains symptomatic. I discussed case with his surgeon Dr. Claud Kelp who recommends start IV ABx, IV fluids, and he will see patient in the emergency department to discuss option of a cystectomy.  Results for orders placed during the hospital encounter of 12/14/11  CBC      Component Value Range   WBC 15.5 (*) 4.0 - 10.5 (K/uL)   RBC 4.82  4.22 - 5.81 (MIL/uL)   Hemoglobin 14.6  13.0 - 17.0 (g/dL)   HCT 69.6  29.5 - 28.4 (%)   MCV 82.2  78.0 - 100.0 (fL)   MCH 30.3  26.0 - 34.0 (pg)   MCHC 36.9 (*) 30.0 - 36.0 (g/dL)   RDW 13.2  44.0 - 10.2 (%)   Platelets 260  150 - 400 (K/uL)  DIFFERENTIAL      Component Value Range   Neutrophils Relative 90 (*) 43 - 77 (%)   Neutro Abs 13.9 (*) 1.7 - 7.7 (K/uL)   Lymphocytes Relative 4 (*) 12 - 46 (%)   Lymphs Abs 0.6 (*) 0.7 - 4.0 (K/uL)   Monocytes Relative 5  3 - 12 (%)   Monocytes Absolute 0.8  0.1 - 1.0 (K/uL)   Eosinophils Relative 1  0 - 5 (%)   Eosinophils Absolute 0.2  0.0 - 0.7 (K/uL)   Basophils Relative 0  0 - 1 (%)   Basophils Absolute 0.0  0.0 - 0.1 (K/uL)  COMPREHENSIVE METABOLIC PANEL      Component Value Range   Sodium 137  135 - 145 (mEq/L)   Potassium 3.9  3.5 - 5.1 (mEq/L)   Chloride 97  96 - 112  (mEq/L)   CO2 28  19 - 32 (mEq/L)   Glucose, Bld 108 (*) 70 - 99 (mg/dL)   BUN 21  6 - 23 (mg/dL)   Creatinine, Ser 9.60  0.50 - 1.35 (mg/dL)   Calcium 9.4  8.4 - 45.4 (mg/dL)   Total Protein 8.2  6.0 - 8.3 (g/dL)   Albumin 4.5  3.5 - 5.2 (g/dL)   AST 098 (*) 0 - 37 (U/L)   ALT 254 (*) 0 - 53 (U/L)   Alkaline Phosphatase 106  39 - 117 (U/L)   Total Bilirubin 0.6  0.3 - 1.2 (mg/dL)   GFR calc non Af Amer 76 (*) >90 (mL/min)   GFR calc Af Amer 88 (*) >90 (mL/min)  URINALYSIS, ROUTINE W REFLEX MICROSCOPIC      Component Value Range   Color, Urine YELLOW  YELLOW    APPearance CLEAR  CLEAR    Specific Gravity, Urine 1.021  1.005 - 1.030    pH 7.5  5.0 - 8.0    Glucose, UA NEGATIVE  NEGATIVE (mg/dL)   Hgb urine dipstick NEGATIVE  NEGATIVE    Bilirubin Urine NEGATIVE  NEGATIVE    Ketones, ur NEGATIVE  NEGATIVE (mg/dL)   Protein, ur NEGATIVE  NEGATIVE (mg/dL)   Urobilinogen, UA 0.2  0.0 - 1.0 (mg/dL)   Nitrite NEGATIVE  NEGATIVE    Leukocytes, UA NEGATIVE  NEGATIVE   LIPASE, BLOOD      Component Value Range   Lipase 38  11 - 59 (U/L)   US Abdomen Complete  12/15/2011  *RADIOLOGY REPORT*  Clinical Data:  Right upper quadrant abdominal pain and elevated liver function tests.  COMPLETE ABDOMINAL ULTRASOUND  Comparison:  None.  Findings:  Gallbladder:  Multiple small stones within a contracted gallbladder resulting in wall echo shadow complex.  The gallbladder wall is not thickened and there is no pericholecystic edema.  Negative Murphy's sign.  Common bile duct:  Normal caliber, measuring 3 mm diameter.  Liver:  No focal lesion identified.  Within normal limits in parenchymal echogenicity.  IVC:  Limited visualization.  Pancreas:  Not visualized due to overlying bowel gas.  Spleen:  Spleen length measures 11.3 cm.  Normal homogeneous parenchymal echotexture.  Right Kidney:  Right kidney measures 11 cm length.  No hydronephrosis.  Left Kidney:  Left kidney measures 10.2 cm length.  No  hydronephrosis.  Abdominal aorta:  Not well visualized due to overlying bowel gas.  IMPRESSION: Cholelithiasis with contracted gallbladder.  No wall thickening. Negative Murphy's sign.  Findings are nonspecific for acute cholecystitis.  Original Report Authenticated By: Marlon Pel, M.D.   Dg Abd Acute W/chest  12/14/2011  *RADIOLOGY REPORT*  Clinical Data: Abdominal pain.  Nausea.  Vomiting.  Recent right inguinal hernia repair.  ACUTE ABDOMEN SERIES (ABDOMEN 2 VIEW & CHEST 1 VIEW)  Comparison: None.  Findings: Cardiac and mediastinal contours appear normal.  The lungs appear clear.  No pleural effusion is identified.  No free intraperitoneal gas noted.  Air-fluid levels are present in the ascending and transverse colon.  No dilated bowel noted.  The small bowel is predominately gasless.  No significant abnormal calcific densities noted.  IMPRESSION: 1.  No significant abnormal findings to account the patient's abdominal pain, nausea, and vomiting.  Original Report Authenticated By: Dellia Cloud, M.D.      Sunnie Nielsen, MD 12/15/11 [phone]

## 2011-12-15 ENCOUNTER — Encounter (HOSPITAL_COMMUNITY): Payer: Self-pay

## 2011-12-15 ENCOUNTER — Inpatient Hospital Stay (HOSPITAL_COMMUNITY): Payer: BC Managed Care – PPO

## 2011-12-15 ENCOUNTER — Emergency Department (HOSPITAL_COMMUNITY): Payer: BC Managed Care – PPO

## 2011-12-15 DIAGNOSIS — K8020 Calculus of gallbladder without cholecystitis without obstruction: Secondary | ICD-10-CM | POA: Diagnosis present

## 2011-12-15 DIAGNOSIS — K8010 Calculus of gallbladder with chronic cholecystitis without obstruction: Secondary | ICD-10-CM

## 2011-12-15 LAB — COMPREHENSIVE METABOLIC PANEL
ALT: 254 U/L — ABNORMAL HIGH (ref 0–53)
BUN: 21 mg/dL (ref 6–23)
CO2: 28 mEq/L (ref 19–32)
Calcium: 9.4 mg/dL (ref 8.4–10.5)
Creatinine, Ser: 1.21 mg/dL (ref 0.50–1.35)
GFR calc Af Amer: 88 mL/min — ABNORMAL LOW (ref >90)
GFR calc non Af Amer: 76 mL/min — ABNORMAL LOW (ref >90)
Glucose, Bld: 108 mg/dL — ABNORMAL HIGH (ref 70–99)
Total Protein: 8.2 g/dL (ref 6.0–8.3)

## 2011-12-15 MED ORDER — ONDANSETRON HCL 4 MG/2ML IJ SOLN
4.0000 mg | Freq: Once | INTRAMUSCULAR | Status: AC
  Administered 2011-12-15: 4 mg via INTRAVENOUS
  Filled 2011-12-15: qty 2

## 2011-12-15 MED ORDER — TECHNETIUM TC 99M MEBROFENIN IV KIT
5.0000 | PACK | Freq: Once | INTRAVENOUS | Status: AC | PRN
  Administered 2011-12-15: 5 via INTRAVENOUS

## 2011-12-15 MED ORDER — HYDROCODONE-ACETAMINOPHEN 5-325 MG PO TABS
1.0000 | ORAL_TABLET | ORAL | Status: DC | PRN
  Administered 2011-12-15: 1 via ORAL
  Filled 2011-12-15: qty 2

## 2011-12-15 MED ORDER — CIPROFLOXACIN IN D5W 400 MG/200ML IV SOLN: 400.0000 mg | Freq: Once | INTRAVENOUS | Status: DC

## 2011-12-15 MED ORDER — HYDROMORPHONE HCL PF 1 MG/ML IJ SOLN: 1.0000 mg | INTRAMUSCULAR | Status: DC | PRN

## 2011-12-15 MED ORDER — PIPERACILLIN-TAZOBACTAM 3.375 G IVPB
3.3750 g | Freq: Once | INTRAVENOUS | Status: AC
  Administered 2011-12-15: 3.375 g via INTRAVENOUS
  Filled 2011-12-15: qty 50

## 2011-12-15 MED ORDER — SODIUM CHLORIDE 0.9 % IV BOLUS (SEPSIS)
1000.0000 mL | Freq: Once | INTRAVENOUS | Status: AC
  Administered 2011-12-15: 1000 mL via INTRAVENOUS

## 2011-12-15 MED ORDER — PANTOPRAZOLE SODIUM 40 MG IV SOLR
40.0000 mg | Freq: Every day | INTRAVENOUS | Status: DC
  Administered 2011-12-15: 40 mg via INTRAVENOUS
  Filled 2011-12-15 (×3): qty 40

## 2011-12-15 MED ORDER — SODIUM CHLORIDE 0.9 % IV SOLN: INTRAVENOUS | Status: DC

## 2011-12-15 MED ORDER — POTASSIUM CHLORIDE IN NACL 20-0.9 MEQ/L-% IV SOLN
INTRAVENOUS | Status: DC
  Administered 2011-12-15 – 2011-12-16 (×2): via INTRAVENOUS
  Filled 2011-12-15 (×5): qty 1000

## 2011-12-15 MED ORDER — PROMETHAZINE HCL 25 MG/ML IJ SOLN
25.0000 mg | Freq: Once | INTRAMUSCULAR | Status: AC
  Administered 2011-12-15: 25 mg via INTRAVENOUS
  Filled 2011-12-15: qty 1

## 2011-12-15 MED ORDER — TECHNETIUM TC 99M MERTIATIDE: 15.0000 | Freq: Once | INTRAVENOUS | Status: DC | PRN

## 2011-12-15 MED ORDER — PIPERACILLIN-TAZOBACTAM 3.375 G IVPB
3.3750 g | Freq: Three times a day (TID) | INTRAVENOUS | Status: DC
  Administered 2011-12-15 – 2011-12-16 (×4): 3.375 g via INTRAVENOUS
  Filled 2011-12-15 (×8): qty 50

## 2011-12-15 MED ORDER — ONDANSETRON HCL 4 MG/2ML IJ SOLN
4.0000 mg | Freq: Four times a day (QID) | INTRAMUSCULAR | Status: DC | PRN
  Administered 2011-12-15: 4 mg via INTRAVENOUS
  Filled 2011-12-15: qty 2

## 2011-12-15 NOTE — Anesthesia Preprocedure Evaluation (Signed)
Anesthesia Evaluation  Patient identified by MRN, date of birth, ID band Patient awake    Reviewed: Allergy & Precautions, H&P , NPO status , Patient's Chart, lab work & pertinent test results, reviewed documented beta blocker date and time   Airway Mallampati: II TM Distance: >3 FB Neck ROM: Full    Dental  (+) Teeth Intact and Dental Advisory Given   Pulmonary neg pulmonary ROS,  breath sounds clear to auscultation        Cardiovascular negative cardio ROS  Rhythm:Regular Rate:Normal  Denies cardiac symptoms   Neuro/Psych negative neurological ROS  negative psych ROS   GI/Hepatic Neg liver ROS, cholecystitis   Endo/Other  negative endocrine ROS  Renal/GU negative Renal ROS  negative genitourinary   Musculoskeletal negative musculoskeletal ROS (+)   Abdominal   Peds negative pediatric ROS (+)  Hematology negative hematology ROS (+)   Anesthesia Other Findings   Reproductive/Obstetrics negative OB ROS                           Anesthesia Physical Anesthesia Plan  ASA: I  Anesthesia Plan: General   Post-op Pain Management:    Induction: Intravenous  Airway Management Planned:   Additional Equipment:   Intra-op Plan:   Post-operative Plan: Extubation in OR  Informed Consent: I have reviewed the patients History and Physical, chart, labs and discussed the procedure including the risks, benefits and alternatives for the proposed anesthesia with the patient or authorized representative who has indicated his/her understanding and acceptance.   Dental advisory given  Plan Discussed with: CRNA and Surgeon  Anesthesia Plan Comments:         Anesthesia Quick Evaluation

## 2011-12-15 NOTE — ED Notes (Signed)
Radiology called and stated they would be down to do xray.

## 2011-12-15 NOTE — H&P (Signed)
Thomas Gilmore is an 36 y.o. male.    Chief Complaint: abdominal pain, nausea and vomiting  HPI: this is a generally healthy 36 year old gentleman known to me and to his primary care physician, Dr. Darryll Capers.  He underwent elective right inguinal hernia repair with mesh on 12/06/2011. They have been no problems with that. He presents to the emergency room with a 24-hour history of right upper quadrant pain and 2 episodes of nausea and vomiting. He denies any prior history of gallbladder disease. He did have a borderline elevated liver function tests in January which were noted. On evaluation in the emergency department he was noted to have mild right upper quadrant tenderness. Vital signs are stable. Ultrasound shows multiple gallstones and a contracted gallbladder but no thickening or inflammatory changes. Liver function tests showed AST 220, ALT 254, otherwise normal, lipase 38, white blood cell count 15,500, hemoglobin 14.6. He has been started on pain medicine, anti-emetics, and antibiotics. I was called to see him by the emergency department physician.  He is feeling somewhat better now and he says he has been a little bit dizzy when he sits up since he  is taking some Phenergan. He is in no acute distress.  Past Medical History  Diagnosis Date  . Rotator cuff injury   . Hyperlipidemia   . Inguinal hernia   . Umbilical hernia     Past Surgical History  Procedure Date  . Appendectomy 12/1993    Family History  Problem Relation Age of Onset  . Hypertension    . Hyperlipidemia    . Anuerysm     Social History:  reports that he quit smoking about 4 years ago. His smoking use included Cigarettes. He has a 11 pack-year smoking history. He has never used smokeless tobacco. He reports that he drinks alcohol. He reports that he does not use illicit drugs.  Allergies: No Known Allergies   (Not in a hospital admission)  Results for orders placed during the hospital encounter of 12/14/11  (from the past 48 hour(s))  CBC     Status: Abnormal   Collection Time   12/14/11 10:40 PM      Component Value Range Comment   WBC 15.5 (*) 4.0 - 10.5 (K/uL)    RBC 4.82  4.22 - 5.81 (MIL/uL)    Hemoglobin 14.6  13.0 - 17.0 (g/dL)    HCT 19.1  47.8 - 29.5 (%)    MCV 82.2  78.0 - 100.0 (fL)    MCH 30.3  26.0 - 34.0 (pg)    MCHC 36.9 (*) 30.0 - 36.0 (g/dL)    RDW 62.1  30.8 - 65.7 (%)    Platelets 260  150 - 400 (K/uL)   DIFFERENTIAL     Status: Abnormal   Collection Time   12/14/11 10:40 PM      Component Value Range Comment   Neutrophils Relative 90 (*) 43 - 77 (%)    Neutro Abs 13.9 (*) 1.7 - 7.7 (K/uL)    Lymphocytes Relative 4 (*) 12 - 46 (%)    Lymphs Abs 0.6 (*) 0.7 - 4.0 (K/uL)    Monocytes Relative 5  3 - 12 (%)    Monocytes Absolute 0.8  0.1 - 1.0 (K/uL)    Eosinophils Relative 1  0 - 5 (%)    Eosinophils Absolute 0.2  0.0 - 0.7 (K/uL)    Basophils Relative 0  0 - 1 (%)    Basophils Absolute 0.0  0.0 -  0.1 (K/uL)   COMPREHENSIVE METABOLIC PANEL     Status: Abnormal   Collection Time   12/14/11 10:40 PM      Component Value Range Comment   Sodium 137  135 - 145 (mEq/L)    Potassium 3.9  3.5 - 5.1 (mEq/L)    Chloride 97  96 - 112 (mEq/L)    CO2 28  19 - 32 (mEq/L)    Glucose, Bld 108 (*) 70 - 99 (mg/dL)    BUN 21  6 - 23 (mg/dL)    Creatinine, Ser 1.19  0.50 - 1.35 (mg/dL)    Calcium 9.4  8.4 - 10.5 (mg/dL)    Total Protein 8.2  6.0 - 8.3 (g/dL)    Albumin 4.5  3.5 - 5.2 (g/dL)    AST 147 (*) 0 - 37 (U/L)    ALT 254 (*) 0 - 53 (U/L)    Alkaline Phosphatase 106  39 - 117 (U/L)    Total Bilirubin 0.6  0.3 - 1.2 (mg/dL)    GFR calc non Af Amer 76 (*) >90 (mL/min)    GFR calc Af Amer 88 (*) >90 (mL/min)   LIPASE, BLOOD     Status: Normal   Collection Time   12/14/11 10:40 PM      Component Value Range Comment   Lipase 38  11 - 59 (U/L)   URINALYSIS, ROUTINE W REFLEX MICROSCOPIC     Status: Normal   Collection Time   12/14/11 11:20 PM      Component Value Range  Comment   Color, Urine YELLOW  YELLOW     APPearance CLEAR  CLEAR     Specific Gravity, Urine 1.021  1.005 - 1.030     pH 7.5  5.0 - 8.0     Glucose, UA NEGATIVE  NEGATIVE (mg/dL)    Hgb urine dipstick NEGATIVE  NEGATIVE     Bilirubin Urine NEGATIVE  NEGATIVE     Ketones, ur NEGATIVE  NEGATIVE (mg/dL)    Protein, ur NEGATIVE  NEGATIVE (mg/dL)    Urobilinogen, UA 0.2  0.0 - 1.0 (mg/dL)    Nitrite NEGATIVE  NEGATIVE     Leukocytes, UA NEGATIVE  NEGATIVE  MICROSCOPIC NOT DONE ON URINES WITH NEGATIVE PROTEIN, BLOOD, LEUKOCYTES, NITRITE, OR GLUCOSE <1000 mg/dL.   US Abdomen Complete  12/15/2011  *RADIOLOGY REPORT*  Clinical Data:  Right upper quadrant abdominal pain and elevated liver function tests.  COMPLETE ABDOMINAL ULTRASOUND  Comparison:  None.  Findings:  Gallbladder:  Multiple small stones within a contracted gallbladder resulting in wall echo shadow complex.  The gallbladder wall is not thickened and there is no pericholecystic edema.  Negative Murphy's sign.  Common bile duct:  Normal caliber, measuring 3 mm diameter.  Liver:  No focal lesion identified.  Within normal limits in parenchymal echogenicity.  IVC:  Limited visualization.  Pancreas:  Not visualized due to overlying bowel gas.  Spleen:  Spleen length measures 11.3 cm.  Normal homogeneous parenchymal echotexture.  Right Kidney:  Right kidney measures 11 cm length.  No hydronephrosis.  Left Kidney:  Left kidney measures 10.2 cm length.  No hydronephrosis.  Abdominal aorta:  Not well visualized due to overlying bowel gas.  IMPRESSION: Cholelithiasis with contracted gallbladder.  No wall thickening. Negative Murphy's sign.  Findings are nonspecific for acute cholecystitis.  Original Report Authenticated By: Marlon Pel, M.D.   Dg Abd Acute W/chest  12/14/2011  *RADIOLOGY REPORT*  Clinical Data: Abdominal pain.  Nausea.  Vomiting.  Recent right inguinal hernia repair.  ACUTE ABDOMEN SERIES (ABDOMEN 2 VIEW & CHEST 1 VIEW)   Comparison: None.  Findings: Cardiac and mediastinal contours appear normal.  The lungs appear clear.  No pleural effusion is identified.  No free intraperitoneal gas noted.  Air-fluid levels are present in the ascending and transverse colon.  No dilated bowel noted.  The small bowel is predominately gasless.  No significant abnormal calcific densities noted.  IMPRESSION: 1.  No significant abnormal findings to account the patient's abdominal pain, nausea, and vomiting.  Original Report Authenticated By: Dellia Cloud, M.D.    Review of Systems  Constitutional: Positive for malaise/fatigue. Negative for fever, chills, weight loss and diaphoresis.  HENT: Negative for hearing loss, nosebleeds, congestion, neck pain and tinnitus.   Eyes: Negative for blurred vision, double vision, photophobia and pain.  Respiratory: Negative for cough, hemoptysis, sputum production, shortness of breath and wheezing.   Cardiovascular: Negative for chest pain, palpitations and orthopnea.  Gastrointestinal: Positive for nausea, vomiting and abdominal pain. Negative for heartburn, diarrhea, constipation, blood in stool and melena.  Genitourinary: Negative for dysuria, urgency, frequency and hematuria.  Musculoskeletal: Negative for myalgias, back pain and joint pain.  Skin: Negative for itching and rash.  Neurological: Positive for dizziness and weakness. Negative for tingling, tremors, sensory change, speech change and headaches.  Endo/Heme/Allergies: Negative for environmental allergies. Does not bruise/bleed easily.  Psychiatric/Behavioral: Negative for depression, suicidal ideas, hallucinations and substance abuse.    Blood pressure 101/49, pulse 78, temperature 100 F (37.8 C), temperature source Oral, resp. rate 18, SpO2 96.00%. Physical Exam  Constitutional: He is oriented to person, place, and time. He appears well-developed and well-nourished. No distress.  HENT:  Head: Normocephalic and atraumatic.    Nose: Nose normal.  Mouth/Throat: No oropharyngeal exudate.  Eyes: EOM are normal. Pupils are equal, round, and reactive to light. Right eye exhibits no discharge. Left eye exhibits no discharge. No scleral icterus.  Neck: Normal range of motion. Neck supple. No JVD present. No tracheal deviation present. No thyromegaly present.  Cardiovascular: Normal rate, regular rhythm, normal heart sounds and intact distal pulses.   No murmur heard. Respiratory: Effort normal and breath sounds normal. No respiratory distress. He has no wheezes. He has no rales. He exhibits no tenderness.  GI: Soft. Bowel sounds are normal. He exhibits no distension and no mass. There is tenderness. There is no rebound and no guarding.       Subjectively tender in the right upper quadrant but no guarding, no mass. No distention. Mild tenderness to percussion right costal margin.  Genitourinary:       Right inguinal scar is healing without any complication. Minimal bruising. Minimal swelling. Penis scrotum and testes looked normal. No evidence of infection or recurrence.  Musculoskeletal: Normal range of motion. He exhibits no edema and no tenderness.  Lymphadenopathy:    He has no cervical adenopathy.  Neurological: He is alert and oriented to person, place, and time. Coordination normal.  Skin: Skin is warm and dry. No rash noted. He is not diaphoretic. No erythema. No pallor.  Psychiatric: He has a normal mood and affect. His behavior is normal. Judgment and thought content normal.     Assessment  Probable acute cholecystitis with cholelithiasis  History recent right inguinal hernia repair, no apparent complications  History hyperlipidemia  Remote history of an appendectomy.  Plan:  The patient will be admitted started on IV antibiotics, antiarrhythmics and pain medication and IV hydration  We will obtain a hepatobiliary scan today.  We will plan to proceed with laparoscopic cholecystectomy with  cholangiogram, possible open within the next 24 hours.  I discussed the indications and details of gallbladder surgery with him. Risks and complications have been outlined, including but not limited to bleeding, infection, conversion to open laparotomy, bile leak, injury to adjacent organs such as the main bile duct or intestine, cardiac pulmonary and thromboembolic problems. He understands these issues. His questions were answered. He is in full agreement with this plan.     Angelia Mould. Derrell Lolling, M.D., [facility] General and Minimally invasive Surgery Breast and Colorectal Surgery Office:   [phone] Pager:   [phone]  12/15/2011, 7:31 AM

## 2011-12-16 ENCOUNTER — Encounter (HOSPITAL_COMMUNITY): Payer: Self-pay | Admitting: Anesthesiology

## 2011-12-16 ENCOUNTER — Inpatient Hospital Stay (HOSPITAL_COMMUNITY): Payer: BC Managed Care – PPO | Admitting: Anesthesiology

## 2011-12-16 ENCOUNTER — Encounter (HOSPITAL_COMMUNITY): Admission: EM | Disposition: A | Discharge status: Home / Self Care | Payer: Self-pay | Source: Home / Self Care

## 2011-12-16 ENCOUNTER — Inpatient Hospital Stay (HOSPITAL_COMMUNITY): Payer: BC Managed Care – PPO

## 2011-12-16 HISTORY — PX: CHOLECYSTECTOMY: SHX55

## 2011-12-16 LAB — COMPREHENSIVE METABOLIC PANEL
ALT: 321 U/L — ABNORMAL HIGH (ref 0–53)
AST: 258 U/L — ABNORMAL HIGH (ref 0–37)
Albumin: 3.3 g/dL — ABNORMAL LOW (ref 3.5–5.2)
Alkaline Phosphatase: 83 U/L (ref 39–117)
CO2: 26 mEq/L (ref 19–32)
Chloride: 103 mEq/L (ref 96–112)
GFR calc non Af Amer: 66 mL/min — ABNORMAL LOW (ref >90)
Potassium: 3.6 mEq/L (ref 3.5–5.1)
Sodium: 138 mEq/L (ref 135–145)
Total Bilirubin: 0.7 mg/dL (ref 0.3–1.2)

## 2011-12-16 LAB — LIPASE, BLOOD: Lipase: 28 U/L (ref 11–59)

## 2011-12-16 LAB — CBC
MCH: 29.6 pg (ref 26.0–34.0)
MCHC: 35.3 g/dL (ref 30.0–36.0)
MCV: 83.8 fL (ref 78.0–100.0)
Platelets: 205 10*3/uL (ref 150–400)

## 2011-12-16 SURGERY — LAPAROSCOPIC CHOLECYSTECTOMY WITH INTRAOPERATIVE CHOLANGIOGRAM
Anesthesia: General | Wound class: Clean Contaminated

## 2011-12-16 MED ORDER — ONDANSETRON HCL 4 MG PO TABS: 4.0000 mg | ORAL_TABLET | Freq: Four times a day (QID) | ORAL | Status: DC | PRN

## 2011-12-16 MED ORDER — HEPARIN SODIUM (PORCINE) 5000 UNIT/ML IJ SOLN
5000.0000 [IU] | Freq: Three times a day (TID) | INTRAMUSCULAR | Status: DC
  Filled 2011-12-16 (×3): qty 1

## 2011-12-16 MED ORDER — KETAMINE HCL 10 MG/ML IJ SOLN
INTRAMUSCULAR | Status: DC | PRN
  Administered 2011-12-16: 10 mg via INTRAVENOUS
  Administered 2011-12-16: 20 mg via INTRAVENOUS
  Administered 2011-12-16: 30 mg via INTRAVENOUS

## 2011-12-16 MED ORDER — METOCLOPRAMIDE HCL 5 MG/ML IJ SOLN
INTRAMUSCULAR | Status: DC | PRN
  Administered 2011-12-16: 10 mg via INTRAVENOUS

## 2011-12-16 MED ORDER — LACTATED RINGERS IR SOLN
Status: DC | PRN
  Administered 2011-12-16: 1000 mL

## 2011-12-16 MED ORDER — BUPIVACAINE HCL (PF) 0.5 % IJ SOLN
INTRAMUSCULAR | Status: AC
  Filled 2011-12-16: qty 30

## 2011-12-16 MED ORDER — LACTATED RINGERS IV SOLN
INTRAVENOUS | Status: DC | PRN
  Administered 2011-12-16 (×2): via INTRAVENOUS

## 2011-12-16 MED ORDER — FENTANYL CITRATE 0.05 MG/ML IJ SOLN: 25.0000 ug | INTRAMUSCULAR | Status: DC | PRN

## 2011-12-16 MED ORDER — DEXAMETHASONE SODIUM PHOSPHATE 4 MG/ML IJ SOLN
INTRAMUSCULAR | Status: DC | PRN
  Administered 2011-12-16: 10 mg via INTRAVENOUS

## 2011-12-16 MED ORDER — IOHEXOL 300 MG/ML  SOLN
INTRAMUSCULAR | Status: DC | PRN
  Administered 2011-12-16: 3.5 mL

## 2011-12-16 MED ORDER — HYDROMORPHONE HCL PF 1 MG/ML IJ SOLN
INTRAMUSCULAR | Status: DC | PRN
  Administered 2011-12-16 (×4): 0.5 mg via INTRAVENOUS

## 2011-12-16 MED ORDER — PROPOFOL 10 MG/ML IV EMUL
INTRAVENOUS | Status: DC | PRN
  Administered 2011-12-16: 200 mg via INTRAVENOUS

## 2011-12-16 MED ORDER — ONDANSETRON HCL 4 MG/2ML IJ SOLN
INTRAMUSCULAR | Status: DC | PRN
  Administered 2011-12-16: 4 mg via INTRAVENOUS

## 2011-12-16 MED ORDER — AMPICILLIN-SULBACTAM SODIUM 3 (2-1) G IJ SOLR
3.0000 g | Freq: Four times a day (QID) | INTRAMUSCULAR | Status: AC
  Administered 2011-12-16 – 2011-12-17 (×3): 3 g via INTRAVENOUS
  Filled 2011-12-16 (×3): qty 3

## 2011-12-16 MED ORDER — ONDANSETRON HCL 4 MG/2ML IJ SOLN: 4.0000 mg | Freq: Four times a day (QID) | INTRAMUSCULAR | Status: DC | PRN

## 2011-12-16 MED ORDER — IOHEXOL 300 MG/ML  SOLN
INTRAMUSCULAR | Status: AC
  Filled 2011-12-16: qty 1

## 2011-12-16 MED ORDER — LACTATED RINGERS IV SOLN: INTRAVENOUS | Status: DC

## 2011-12-16 MED ORDER — FENTANYL CITRATE 0.05 MG/ML IJ SOLN
INTRAMUSCULAR | Status: DC | PRN
  Administered 2011-12-16 (×2): 50 ug via INTRAVENOUS
  Administered 2011-12-16 (×2): 25 ug via INTRAVENOUS
  Administered 2011-12-16: 50 ug via INTRAVENOUS

## 2011-12-16 MED ORDER — BUPIVACAINE-EPINEPHRINE 0.5% -1:200000 IJ SOLN
INTRAMUSCULAR | Status: DC | PRN
  Administered 2011-12-16: 18 mL

## 2011-12-16 MED ORDER — LIDOCAINE HCL (CARDIAC) 20 MG/ML IV SOLN
INTRAVENOUS | Status: DC | PRN
  Administered 2011-12-16: 50 mg via INTRAVENOUS

## 2011-12-16 MED ORDER — HYDROCODONE-ACETAMINOPHEN 5-325 MG PO TABS
1.0000 | ORAL_TABLET | ORAL | Status: DC | PRN
  Administered 2011-12-17: 2 via ORAL
  Filled 2011-12-16: qty 2

## 2011-12-16 MED ORDER — MORPHINE SULFATE 10 MG/ML IJ SOLN: 2.0000 mg | INTRAMUSCULAR | Status: DC | PRN

## 2011-12-16 MED ORDER — ROCURONIUM BROMIDE 100 MG/10ML IV SOLN
INTRAVENOUS | Status: DC | PRN
  Administered 2011-12-16: 50 mg via INTRAVENOUS
  Administered 2011-12-16: 20 mg via INTRAVENOUS

## 2011-12-16 MED ORDER — GLYCOPYRROLATE 0.2 MG/ML IJ SOLN
INTRAMUSCULAR | Status: DC | PRN
  Administered 2011-12-16: 0.6 mg via INTRAVENOUS

## 2011-12-16 MED ORDER — NEOSTIGMINE METHYLSULFATE 1 MG/ML IJ SOLN
INTRAMUSCULAR | Status: DC | PRN
  Administered 2011-12-16: 4 mg via INTRAVENOUS

## 2011-12-16 MED ORDER — PROMETHAZINE HCL 25 MG/ML IJ SOLN: 6.2500 mg | INTRAMUSCULAR | Status: DC | PRN

## 2011-12-16 MED ORDER — MIDAZOLAM HCL 5 MG/5ML IJ SOLN
INTRAMUSCULAR | Status: DC | PRN
  Administered 2011-12-16: 2 mg via INTRAVENOUS

## 2011-12-16 MED ORDER — POTASSIUM CHLORIDE IN NACL 20-0.9 MEQ/L-% IV SOLN
INTRAVENOUS | Status: DC
  Administered 2011-12-16 – 2011-12-17 (×2): via INTRAVENOUS
  Filled 2011-12-16 (×3): qty 1000

## 2011-12-16 SURGICAL SUPPLY — 36 items
APPLIER CLIP ROT 10 11.4 M/L (STAPLE)
BENZOIN TINCTURE PRP APPL 2/3 (GAUZE/BANDAGES/DRESSINGS) ×2 IMPLANT
CANISTER SUCTION 2500CC (MISCELLANEOUS) ×2 IMPLANT
CLIP APPLIE ROT 10 11.4 M/L (STAPLE) IMPLANT
CLOTH BEACON ORANGE TIMEOUT ST (SAFETY) ×2 IMPLANT
COVER MAYO STAND STRL (DRAPES) ×2 IMPLANT
DECANTER SPIKE VIAL GLASS SM (MISCELLANEOUS) IMPLANT
DERMABOND ADVANCED (GAUZE/BANDAGES/DRESSINGS) ×1
DERMABOND ADVANCED .7 DNX12 (GAUZE/BANDAGES/DRESSINGS) ×1 IMPLANT
DRAPE C-ARM 42X72 X-RAY (DRAPES) ×2 IMPLANT
DRAPE LAPAROSCOPIC ABDOMINAL (DRAPES) ×2 IMPLANT
ELECT REM PT RETURN 9FT ADLT (ELECTROSURGICAL) ×2
ELECTRODE REM PT RTRN 9FT ADLT (ELECTROSURGICAL) ×1 IMPLANT
GLOVE BIOGEL PI IND STRL 7.0 (GLOVE) ×1 IMPLANT
GLOVE BIOGEL PI INDICATOR 7.0 (GLOVE) ×1
GLOVE EUDERMIC 7 POWDERFREE (GLOVE) ×12 IMPLANT
GOWN STRL NON-REIN LRG LVL3 (GOWN DISPOSABLE) ×2 IMPLANT
GOWN STRL REIN XL XLG (GOWN DISPOSABLE) ×8 IMPLANT
HEMOSTAT SNOW SURGICEL 2X4 (HEMOSTASIS) IMPLANT
IV LACTATED RINGER IRRG 3000ML (IV SOLUTION) ×1
IV LR IRRIG 3000ML ARTHROMATIC (IV SOLUTION) ×1 IMPLANT
KIT BASIN OR (CUSTOM PROCEDURE TRAY) ×2 IMPLANT
NS IRRIG 1000ML POUR BTL (IV SOLUTION) ×2 IMPLANT
POUCH SPECIMEN RETRIEVAL 10MM (ENDOMECHANICALS) ×2 IMPLANT
SET CHOLANGIOGRAPH MIX (MISCELLANEOUS) ×2 IMPLANT
SET IRRIG TUBING LAPAROSCOPIC (IRRIGATION / IRRIGATOR) ×2 IMPLANT
SOLUTION ANTI FOG 6CC (MISCELLANEOUS) ×2 IMPLANT
STRIP CLOSURE SKIN 1/2X4 (GAUZE/BANDAGES/DRESSINGS) ×2 IMPLANT
SUT MNCRL AB 4-0 PS2 18 (SUTURE) ×2 IMPLANT
SYRINGE IRR TOOMEY STRL 70CC (SYRINGE) ×2 IMPLANT
TOWEL OR 17X26 10 PK STRL BLUE (TOWEL DISPOSABLE) ×2 IMPLANT
TRAY LAP CHOLE (CUSTOM PROCEDURE TRAY) ×2 IMPLANT
TROCAR BLADELESS OPT 5 75 (ENDOMECHANICALS) IMPLANT
TROCAR XCEL BLUNT TIP 100MML (ENDOMECHANICALS) ×2 IMPLANT
TROCAR XCEL NON-BLD 11X100MML (ENDOMECHANICALS) IMPLANT
TUBING INSUFFLATION 10FT LAP (TUBING) ×2 IMPLANT

## 2011-12-16 NOTE — Op Note (Signed)
Patient Name:           Thomas Gilmore   Date of Surgery:        12/16/2011  Pre op Diagnosis:      Chronic cholecystitis with cholelithiasis  Post op Diagnosis:    same  Procedure:                 Laparoscopic cholecystectomy with cholangiogram  Surgeon:                     Angelia Mould. Derrell Lolling, M.D., FACS  Assistant:                      none  Operative Indications:   This is a healthy 36 year old Caucasian male who underwent elective right inguinal hernia repair with mesh on 12/06/2011. He has done well done well with that. He has a vague past history of some postprandial cramps but nothing bad. He presented to the emergency room yesterday with a 24-hour history of right upper quadrant pain and 2 episodes of nausea and vomiting. Evaluation in the emergency department revealed elevated SGOT and SGPT, normal lipase, elevated white blood cell count, and ultrasound showed gallstones but no other abnormalities. He was found to be minimally tender in the right upper quadrant. Because of his gallstones, leukocytosis and elevated liver function test he was brought to the hospital and started on antibiotics. Hepatobiliary scan showed that the gallbladder actually did fill and the common duct did empty. He is brought to the operating room for cholecystectomy  Operative Findings:       The gallbladder looked chronically inflamed in that it was somewhat discolored  and there were adhesions to it. It was not acutely inflamed. The cystic duct was tiny and contained numerous very tiny black stones. The cholangiogram was normal, showing normal intrahepatic and extrahepatic biliary anatomy, no dilatation, no filling defect, and no obstruction with good flow of contrast to the duodenum. The liver and duodenum otherwise were normal. The small bowel and large bowel looked normal. Particular attention was paid to the terminal ileum and cecum and sigmoid colon, and there was no inflammatory process.  Procedure in Detail:           Following the induction of general endotracheal anesthesia the patient's abdomen was prepped and draped in a sterile fashion. A dose of antibiotic given just prior to the procedure. Surgical time out was performed. 0.5% Marcaine with epinephrine was used as local infiltration anesthetic. A vertically oriented incision was made at the superior rim of the umbilicus. The fascia was incised in the midline and abdominal cavity entered under direct vision. An 11 mm Hassan trocar was placed and secured with a pursestring suture of 0 Vicryl. Pneumoperitoneum was created. Video cameras inserted. The patient was positioned. A 5 mm trocar was placed in the subxiphoid region and 25 mm trocars placed in the right mid-abdomen. The gallbladder fundus was easily identified and elevated. I took down all the adhesions down off of the body and infundibulum of the gallbladder. I slowly teased the duodenum off of the infundibulum from chronic soft adhesions. I isolated the cystic artery, secured it with multiple metal clips and divided it. I actually found an anterior branch and a posterior branch of the cystic artery and these were controlled separately. I then had a large window behind the cystic duct. The cholangiogram catheter was inserted into the cystic duct and a cholangiogram was obtained with the C-arm.  The cholangiogram was normal as described above. The cholangiocatheter was removed, the cystic duct secured with multiple metal clips and divided. The gallbladder was then dissected from its bed with electrocautery placed in a specimen bag removed. The operative field and the subhepatic space and subphrenic spaces were irrigated with copious amounts of saline. There was no bleeding and no bile leak. All the irrigation fluid was evacuated. The trocars were removed and there was no bleeding from the trocar sites. Pneumoperitoneum was released. The fascia at the umbilicus was closed with 0 Vicryl sutures and the skin  incisions closed with subcuticular sutures of 4-0 Monocryl and Dermabond. The patient was taken to recovery in stable condition. There were no complications. EBL 20 cc. Counts correct.     Angelia Mould. Derrell Lolling, M.D., FACS General and Minimally Invasive Surgery Breast and Colorectal Surgery  12/16/2011 3:30 PM

## 2011-12-16 NOTE — Progress Notes (Signed)
Day of Surgery  Subjective: Patient feels better. Minimal pain. No nausea. Patient now recalls history mild any food intolerance with postprandial epigastric discomfort in the past.  Hepatobiliary scan is normal. Prompt liver uptake and excretion, gallbladder does fill,normal emptying into the small bowel.  Lab work today reveals WBC normal at 4900, hemoglobin 11.9, AST 258, ALT 321, alkaline phosphatase and bilirubin normal. Lipase normal at 28.  He has been n.p.o. Since midnight.  Objective: Vital signs in last 24 hours: Temp:  [97.7 F (36.5 C)-98.7 F (37.1 C)] 97.7 F (36.5 C) (05/16 0610) Pulse Rate:  [60-80] 60  (05/16 0610) Resp:  [16-18] 18  (05/16 0610) BP: (101-131)/(62-72) 107/62 mmHg (05/16 0610) SpO2:  [98 %-100 %] 99 % (05/16 0610) Weight:  [225 lb (102.059 kg)] 225 lb (102.059 kg) (05/15 1318) Last BM Date: 12/15/11  Intake/Output from previous day: 05/15 0701 - 05/16 0700 In: 720 [P.O.:720] Out: 4425 [Urine:4425] Intake/Output this shift:    General appearance: patient is alert and in no distress. Vital status normal. GI: aabdomen is soft, nondistended, objectively not tender. Subjectively minimal discomfort with deep palpation right upper quadrant. No mass. Right inguinal incision healing normally.  Lab Results:  Results for orders placed during the hospital encounter of 12/14/11 (from the past 24 hour(s))  COMPREHENSIVE METABOLIC PANEL     Status: Abnormal   Collection Time   12/16/11  4:24 AM      Component Value Range   Sodium 138  135 - 145 (mEq/L)   Potassium 3.6  3.5 - 5.1 (mEq/L)   Chloride 103  96 - 112 (mEq/L)   CO2 26  19 - 32 (mEq/L)   Glucose, Bld 89  70 - 99 (mg/dL)   BUN 16  6 - 23 (mg/dL)   Creatinine, Ser 1.61 (*) 0.50 - 1.35 (mg/dL)   Calcium 8.7  8.4 - 09.6 (mg/dL)   Total Protein 6.6  6.0 - 8.3 (g/dL)   Albumin 3.3 (*) 3.5 - 5.2 (g/dL)   AST 045 (*) 0 - 37 (U/L)   ALT 321 (*) 0 - 53 (U/L)   Alkaline Phosphatase 83  39 - 117 (U/L)     Total Bilirubin 0.7  0.3 - 1.2 (mg/dL)   GFR calc non Af Amer 66 (*) >90 (mL/min)   GFR calc Af Amer 76 (*) >90 (mL/min)  CBC     Status: Abnormal   Collection Time   12/16/11  4:24 AM      Component Value Range   WBC 4.9  4.0 - 10.5 (K/uL)   RBC 4.02 (*) 4.22 - 5.81 (MIL/uL)   Hemoglobin 11.9 (*) 13.0 - 17.0 (g/dL)   HCT 40.9 (*) 81.1 - 52.0 (%)   MCV 83.8  78.0 - 100.0 (fL)   MCH 29.6  26.0 - 34.0 (pg)   MCHC 35.3  30.0 - 36.0 (g/dL)   RDW 91.4  78.2 - 95.6 (%)   Platelets 205  150 - 400 (K/uL)  LIPASE, BLOOD     Status: Normal   Collection Time   12/16/11  4:24 AM      Component Value Range   Lipase 28  11 - 59 (U/L)     Studies/Results: @RISRSLT24 @     . pantoprazole (PROTONIX) IV  40 mg Intravenous QHS  . piperacillin-tazobactam (ZOSYN)  IV  3.375 g Intravenous Once  . piperacillin-tazobactam (ZOSYN)  IV  3.375 g Intravenous Q8H     Assessment/Plan:   Patient is stable.  Right upper quadrant pain, nausea vomiting, abnormal LFTs, and gallstones strongly suggest recent episode of severe biliary colic. No evidence of acute cholecystitis on HIDA.   Hepatocellular disease or hepatitis C much less likely.  Plan to proceed with laparoscopic cholecystectomy as planned , possible open today. The procedure, techniques, and risks are once again discussed with the patient. He understands this completely and is agreeable with this plan.    LOS: 2 days    Mariza Bourget M. Derrell Lolling, M.D., [facility] General and Minimally invasive Surgery Breast and Colorectal Surgery Office:   [phone] Pager:   [phone]  12/16/2011  . .prob

## 2011-12-16 NOTE — Discharge Instructions (Signed)
No sports, heavy lifting, swimming, running, or strenuous activities for 2 more weeks.  Return to see Dr. Derrell Lolling in the office in 3-4 weeks.    [facility] LAPAROS[facility] SUR[facility]: POST OP INSTRUCTIONS Always review your discharge instruction sheet given to you by the facility where your surgery was performed. IF YOU HAVE DISABILITY OR FAMILY LEAVE FORMS, YOU MUST BRING THEM TO THE OFFICE FOR PROCESSING.   DO NOT GIVE THEM TO YOUR DOCTOR.  1. A prescription for pain medication may be given to you upon discharge.  Take your pain medication as prescribed, if needed.  If narcotic pain medicine is not needed, then you may take acetaminophen (Tylenol) or ibuprofen (Advil) as needed. 2. Take your usually prescribed medications unless otherwise directed. 3. If you need a refill on your pain medication, please contact your pharmacy.  They will contact our office to request authorization. Prescriptions will not be filled after 5pm or on week-ends. 4. You should follow a light diet the first few days after arrival home, such as soup and crackers, etc.  Be sure to include lots of fluids daily. 5. Most patients will experience some swelling and bruising in the area of the incisions.  Ice packs will help.  Swelling and bruising can take several days to resolve.  6. It is common to experience some constipation if taking pain medication after surgery.  Increasing fluid intake and taking a stool softener (such as Colace) will usually help or prevent this problem from occurring.  A mild laxative (Milk of Magnesia or Miralax) should be taken according to package instructions if there are no bowel movements after 48 hours. 7. Unless discharge instructions indicate otherwise, you may remove your bandages 24-48 hours after surgery, and you may shower at that time.  You may have steri-strips (small skin tapes) in place directly over the incision.  These strips should be left on the skin for  7-10 days.  If your surgeon used skin glue on the incision, you may shower in 24 hours.  The glue will flake off over the next 2-3 weeks.  Any sutures or staples will be removed at the office during your follow-up visit. 8. ACTIVITIES:  You may resume regular (light) daily activities beginning the next day--such as daily self-care, walking, climbing stairs--gradually increasing activities as tolerated.  You may have sexual intercourse when it is comfortable.  Refrain from any heavy lifting or straining until approved by your doctor. a. You may drive when you are no longer taking prescription pain medication, you can comfortably wear a seatbelt, and you can safely maneuver your car and apply brakes. b. RETURN TO WORK:  __________________________________________________________ 9. You should see your doctor in the office for a follow-up appointment approximately 2-3 weeks after your surgery.  Make sure that you call for this appointment within a day or two after you arrive home to insure a convenient appointment time. 10. OTHER INSTRUCTIONS: __________________________________________________________________________________________________________________________ __________________________________________________________________________________________________________________________ WHEN TO CALL YOUR DOCTOR: 1. Fever over 101.0 2. Inability to urinate 3. Continued bleeding from incision. 4. Increased pain, redness, or drainage from the incision. 5. Increasing abdominal pain  The clinic staff is available to answer your questions during regular business hours.  Please don't hesitate to call and ask to speak to one of the nurses for clinical concerns.  If you have a medical emergency, go to the nearest emergency room or call 911.  A surgeon from [facility] is always on call at the hospital. [address], [address], [place], [place]  [address] ? [address] [address], [place], [place]    [address] [phone] ? [phone] ? FAX [phone] Web site: [email or web address]

## 2011-12-16 NOTE — Anesthesia Procedure Notes (Signed)
Procedure Name: Intubation Date/Time: 12/16/2011 1:21 PM Performed by: Randon Goldsmith CATHERINE PAYNE Pre-anesthesia Checklist: Patient identified, Emergency Drugs available, Suction available and Patient being monitored Patient Re-evaluated:Patient Re-evaluated prior to inductionOxygen Delivery Method: Circle system utilized Preoxygenation: Pre-oxygenation with 100% oxygen Intubation Type: IV induction Ventilation: Mask ventilation without difficulty Laryngoscope Size: Miller and 3 Grade View: Grade I Tube type: Oral Tube size: 7.5 mm Number of attempts: 1 Placement Confirmation: ETT inserted through vocal cords under direct vision,  positive ETCO2,  CO2 detector and breath sounds checked- equal and bilateral Secured at: 22 cm Tube secured with: Tape Dental Injury: Teeth and Oropharynx as per pre-operative assessment

## 2011-12-16 NOTE — Transfer of Care (Signed)
Immediate Anesthesia Transfer of Care Note  Patient: Thomas Gilmore  Procedure(s) Performed: Procedure(s) (LRB): LAPAROSCOPIC CHOLECYSTECTOMY WITH INTRAOPERATIVE CHOLANGIOGRAM (N/A)  Patient Location: PACU  Anesthesia Type: General  Level of Consciousness: awake, alert , oriented and patient cooperative  Airway & Oxygen Therapy: Patient Spontanous Breathing and Patient connected to face mask oxygen  Post-op Assessment: Report given to PACU RN, Post -op Vital signs reviewed and stable and Patient moving all extremities  Post vital signs: Reviewed and stable  Complications: No apparent anesthesia complications

## 2011-12-16 NOTE — Addendum Note (Signed)
Addendum  created 12/16/11 1540 by Einar Pheasant, MD   Modules edited:Orders, PRL Based Order Sets

## 2011-12-16 NOTE — Addendum Note (Signed)
Addendum  created 12/16/11 1540 by Tremaine Fuhriman F Wanell Lorenzi, MD   Modules edited:Orders, PRL Based Order Sets    

## 2011-12-16 NOTE — Preoperative (Signed)
Beta Blockers   Reason not to administer Beta Blockers:Not Applicable, not on home BB 

## 2011-12-16 NOTE — Anesthesia Postprocedure Evaluation (Signed)
  Anesthesia Post-op Note  Patient: Thomas Gilmore  Procedure(s) Performed: Procedure(s) (LRB): LAPAROSCOPIC CHOLECYSTECTOMY WITH INTRAOPERATIVE CHOLANGIOGRAM (N/A)  Patient Location: PACU  Anesthesia Type: General  Level of Consciousness: awake and alert   Airway and Oxygen Therapy: Patient Spontanous Breathing  Post-op Pain: mild  Post-op Assessment: Post-op Vital signs reviewed, Patient's Cardiovascular Status Stable, Respiratory Function Stable, Patent Airway and No signs of Nausea or vomiting  Post-op Vital Signs: stable  Complications: No apparent anesthesia complications

## 2011-12-16 NOTE — Progress Notes (Signed)
UR complete 

## 2011-12-17 ENCOUNTER — Telehealth (INDEPENDENT_AMBULATORY_CARE_PROVIDER_SITE_OTHER): Payer: Self-pay | Admitting: General Surgery

## 2011-12-17 ENCOUNTER — Encounter (HOSPITAL_COMMUNITY): Payer: Self-pay | Admitting: General Surgery

## 2011-12-17 LAB — COMPREHENSIVE METABOLIC PANEL
Alkaline Phosphatase: 83 U/L (ref 39–117)
BUN: 15 mg/dL (ref 6–23)
Chloride: 103 mEq/L (ref 96–112)
Creatinine, Ser: 1.02 mg/dL (ref 0.50–1.35)
GFR calc Af Amer: >90 mL/min (ref >90)
Glucose, Bld: 103 mg/dL — ABNORMAL HIGH (ref 70–99)
Potassium: 3.6 mEq/L (ref 3.5–5.1)
Total Bilirubin: 0.4 mg/dL (ref 0.3–1.2)
Total Protein: 6.6 g/dL (ref 6.0–8.3)

## 2011-12-17 LAB — CBC
HCT: 31.8 % — ABNORMAL LOW (ref 39.0–52.0)
Hemoglobin: 11.4 g/dL — ABNORMAL LOW (ref 13.0–17.0)
MCHC: 35.8 g/dL (ref 30.0–36.0)
MCV: 82.2 fL (ref 78.0–100.0)
RDW: 12.2 % (ref 11.5–15.5)

## 2011-12-17 MED ORDER — HYDROCODONE-ACETAMINOPHEN 5-325 MG PO TABS: 1.0000 | ORAL_TABLET | ORAL | Status: AC | PRN

## 2011-12-17 NOTE — Discharge Summary (Signed)
Cr normal. Discharged.

## 2011-12-17 NOTE — Discharge Summary (Signed)
Physician Discharge Summary  Patient ID: Thomas Gilmore MRN: [id number] DOB/AGE: 1976/05/20 36 y.o.  Primary Care:  Darryll Capers  Admit date: 12/14/2011 Discharge date: 12/17/2011  Admission Diagnoses: Probable acute cholecystitis with cholelithiasis History recent right inguinal hernia repair, no apparent complications History hyperlipidemia Remote history of an appendectomy.      Discharge Diagnoses: Chronic cholecystitis with cholelithiasis History recent right inguinal hernia repair, no apparent complications History hyperlipidemia Remote history of an appendectomy.  Elevated LFT's  Mild renal insuffiencey pre op with nausea and vomiting.   Principal Problem:  *Gallstones   PROCEDURES: Laparoscopic cholecystectomy with cholangiogram 12/16/11 Dr. Blanchie Serve Course: This is a healthy 36 year old Caucasian male who underwent elective right inguinal hernia repair with mesh on 12/06/2011. He has done well done well with that. He has a vague past history of some postprandial cramps but nothing bad. He presented to the emergency room yesterday with a 24-hour history of right upper quadrant pain and 2 episodes of nausea and vomiting. Evaluation in the emergency department revealed elevated SGOT and SGPT, normal lipase, elevated white blood cell count, and ultrasound showed gallstones but no other abnormalities. He was found to be minimally tender in the right upper quadrant. Because of his gallstones, leukocytosis and elevated liver function test he was brought to the hospital and started on antibiotics. Hepatobiliary scan showed that the gallbladder actually did fill and the common duct did empty. He is brought to the operating room for cholecystectomy He underwent the above procedure and is doing well.  His lft, and creatinine were elevated pre op.  We will check those and if OK plan discharge latter  Today.  Follow up with Dr. Derrell Lolling in 2 weeks.  Condition on d/c:   Improved  CBC    Component Value Date/Time   WBC 9.5 12/17/2011 0800   RBC 3.87* 12/17/2011 0800   HGB 11.4* 12/17/2011 0800   HCT 31.8* 12/17/2011 0800   PLT 224 12/17/2011 0800   MCV 82.2 12/17/2011 0800   MCH 29.5 12/17/2011 0800   MCHC 35.8 12/17/2011 0800   RDW 12.2 12/17/2011 0800   LYMPHSABS 0.6* 12/14/2011 2240   MONOABS 0.8 12/14/2011 2240   EOSABS 0.2 12/14/2011 2240   BASOSABS 0.0 12/14/2011 2240     Lab 12/17/11 0800 12/16/11 0424 12/14/11 2240  AST 125* 258* 220*  ALT 270* 321* 254*  ALKPHOS 83 83 106  BILITOT 0.4 0.7 0.6  PROT 6.6 6.6 8.2  ALBUMIN 3.4* 3.3* 4.5    CMP     Component Value Date/Time   NA 139 12/17/2011 0800   K 3.6 12/17/2011 0800   CL 103 12/17/2011 0800   CO2 28 12/17/2011 0800   GLUCOSE 103* 12/17/2011 0800   BUN 15 12/17/2011 0800   CREATININE 1.02 12/17/2011 0800   CALCIUM 8.4 12/17/2011 0800   PROT 6.6 12/17/2011 0800   ALBUMIN 3.4* 12/17/2011 0800   AST 125* 12/17/2011 0800   ALT 270* 12/17/2011 0800   ALKPHOS 83 12/17/2011 0800   BILITOT 0.4 12/17/2011 0800   GFRNONAA >90 12/17/2011 0800   GFRAA >90 12/17/2011 0800         Disposition: Final discharge disposition not confirmed  Discharge Orders    Future Appointments: Provider: Department: Dept Phone: Center:   12/22/2011 1:45 PM Ernestene Mention, MD [facility] [phone] None     Medication List  As of 12/17/2011  7:57 AM   STOP taking these medications  oxyCODONE-acetaminophen 5-500 MG per capsule         TAKE these medications         HYDROcodone-acetaminophen 5-325 MG per tablet   Commonly known as: NORCO   Take 1-2 tablets by mouth every 4 (four) hours as needed.           Follow-up Information    Follow up with Ernestene Mention, MD. Schedule an appointment as soon as possible for a visit in 4 weeks.   Contact information:   [facility] [address], [address] [place] [address] [phone]           Signed: Sherrie George 12/17/2011, 7:57 AM

## 2011-12-17 NOTE — Progress Notes (Signed)
1 Day Post-Op  Subjective: Feels much better up eating breakfast.  Objective: Vital signs in last 24 hours: Temp:  [97.6 F (36.4 C)-98.7 F (37.1 C)] 97.9 F (36.6 C) (05/17 0620) Pulse Rate:  [51-66] 57  (05/17 0620) Resp:  [14-20] 20  (05/17 0620) BP: (100-142)/(61-79) 114/68 mmHg (05/17 0620) SpO2:  [94 %-100 %] 96 % (05/17 0620) Last BM Date: 12/15/11 1320 PO intake recorded, afebrile, VSS, Intake/Output from previous day: 05/16 0701 - 05/17 0700 In: 3768.3 [P.O.:1320; I.V.:2448.3] Out: 3550 [Urine:3500; Blood:50] Intake/Output this shift:    General appearance: alert, cooperative and no distress Resp: clear to auscultation bilaterally GI: soft, non-tender; bowel sounds normal; no masses,  no organomegaly and incisions look good he took some percocet last pm no complaints  Lab Results:   Basename 12/16/11 0424 12/14/11 2240  WBC 4.9 15.5*  HGB 11.9* 14.6  HCT 33.7* 39.6  PLT 205 260    BMET  Basename 12/16/11 0424 12/14/11 2240  NA 138 137  K 3.6 3.9  CL 103 97  CO2 26 28  GLUCOSE 89 108*  BUN 16 21  CREATININE 1.36* 1.21  CALCIUM 8.7 9.4   PT/INR No results found for this basename: LABPROT:2,INR:2 in the last 72 hours   Lab 12/16/11 0424 12/14/11 2240  AST 258* 220*  ALT 321* 254*  ALKPHOS 83 106  BILITOT 0.7 0.6  PROT 6.6 8.2  ALBUMIN 3.3* 4.5     Lipase     Component Value Date/Time   LIPASE 28 12/16/2011 0424     Studies/Results: Dg Cholangiogram Operative  12/16/2011  *RADIOLOGY REPORT*  Clinical Data:   Right upper quadrant pain, gallstones.  INTRAOPERATIVE CHOLANGIOGRAM  Technique:  Cholangiographic images from the C-arm fluoroscopic device were submitted for interpretation post-operatively.  Please see the procedural report for the amount of contrast and the fluoroscopy time utilized.  Comparison:   Ultrasound 12/15/2011 showing gallstones.  Findings:  The gallbladder has been removed and the cystic duct cannulated.  Contrast injection  shows good opacification of the biliary tree without filling defects or delay in excretion of contrast into the duodenum.  IMPRESSION: Negative operative cholangiogram.  Original Report Authenticated By: Elsie Stain, M.D.   Nm Hepatobiliary  12/15/2011  *RADIOLOGY REPORT*  Clinical Data: Abdominal pain.  Gallstones.  NUCLEAR MEDICINE HEPATOHBILIARY INCLUDE GB  Radiopharmaceutical:5.0 mCi technetium 44m Choletec.  Comparison: None.  Findings: The liver demonstrates normal, homogeneous radiotracer distribution.  The gallbladder is promptly visualized within the first 5 minutes.  Radiotracer flows into small bowel.  IMPRESSION: Negative for cholecystitis.  Normal study.  Original Report Authenticated By: Bernadene Bell. Maricela Curet, M.D.    Medications:    . ampicillin-sulbactam (UNASYN) IV  3 g Intravenous Q6H  . heparin  5,000 Units Subcutaneous Q8H  . DISCONTD: pantoprazole (PROTONIX) IV  40 mg Intravenous QHS  . DISCONTD: piperacillin-tazobactam (ZOSYN)  IV  3.375 g Intravenous Q8H    Assessment/Plan 1.Chronic cholecystitis with cholelithiasis s/p Laparoscopic cholecystectomy with cholangiogram 12/16/11 DR. Ingram 2. RIH with mesh 12/06/11 3.Mild Renal insuffiencey, with pre op nausea and vomiting 4. Elevated lft's  Plan: Recheck labs and if ok home later this AM.       LOS: 3 days    Thomas Gilmore 12/17/2011

## 2011-12-17 NOTE — Progress Notes (Signed)
Agree 

## 2011-12-22 ENCOUNTER — Encounter (INDEPENDENT_AMBULATORY_CARE_PROVIDER_SITE_OTHER): Payer: BC Managed Care – PPO | Admitting: General Surgery

## 2012-01-14 ENCOUNTER — Ambulatory Visit (INDEPENDENT_AMBULATORY_CARE_PROVIDER_SITE_OTHER): Payer: BC Managed Care – PPO | Admitting: General Surgery

## 2012-01-14 ENCOUNTER — Encounter (INDEPENDENT_AMBULATORY_CARE_PROVIDER_SITE_OTHER): Payer: Self-pay | Admitting: General Surgery

## 2012-01-14 VITALS — BP 130/82 | HR 68 | Temp 98.0°F | Resp 18 | Ht 73.0 in | Wt 223.0 lb

## 2012-01-14 DIAGNOSIS — K8020 Calculus of gallbladder without cholecystitis without obstruction: Secondary | ICD-10-CM

## 2012-01-14 NOTE — Progress Notes (Signed)
Subjective:     Patient ID: Thomas Gilmore, male   DOB: 1975/12/28, 36 y.o.   MRN: [id number]  HPI  This patient underwent right inguinal hernia repair approximate 6 weeks ago. Within 10 days of the surgery he developed acute cholecystitis. He underwent laparoscopic cholecystectomy with cholangiogram on May 16. His cholangiogram was normal. Final pathology showed chronic cholecystitis with cholelithiasis.  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 He has gone back to the gym. He has no pain. Appetite normal. Bowel function normal. No wound problems. Review of Systems     Objective:   Physical Exam Patient looks well. No distress.  Abdominal trocar incisions well healed. Right groin incision well-healed. No wound complications.    Assessment:     Right inguinal hernia repair, uneventful recovery and wound healing.  Acute cholecystitis with cholelithiasis, recovering uneventfully following urgent laparoscopic cholecystectomy with cholangiogram.    Plan:     May return to work, exercise and sports without restriction.  Low-fat diet advised  Return to see me p.r.n.   Angelia Mould. Derrell Lolling, M.D., [facility] General and Minimally invasive Surgery Breast and Colorectal Surgery Office:   [phone] Pager:   [phone]

## 2012-01-14 NOTE — Patient Instructions (Signed)
You may resume all normal physical activities. There are no restrictions. Be sure to stretch well before exercise or strenuous work.  Low-fat diet is a good idea.  Return to see Dr. Derrell Lolling if any further problems arise.

## 2012-10-30 IMAGING — RF DG CHOLANGIOGRAM OPERATIVE
1 series · 4 of 4 positions shown · non-contrast
Comparison: Ultrasound 12/15/2011 showing gallstones.

CLINICAL DATA: Right upper quadrant pain, gallstones.

INTRAOPERATIVE CHOLANGIOGRAM
TECHNIQUE: Cholangiographic images from the C-arm fluoroscopic
device were submitted for interpretation post-operatively.  Please
see the procedural report for the amount of contrast and the
fluoroscopy time utilized.

[Series 1: run · 4 of 80 frames shown]
[frame 13/80]
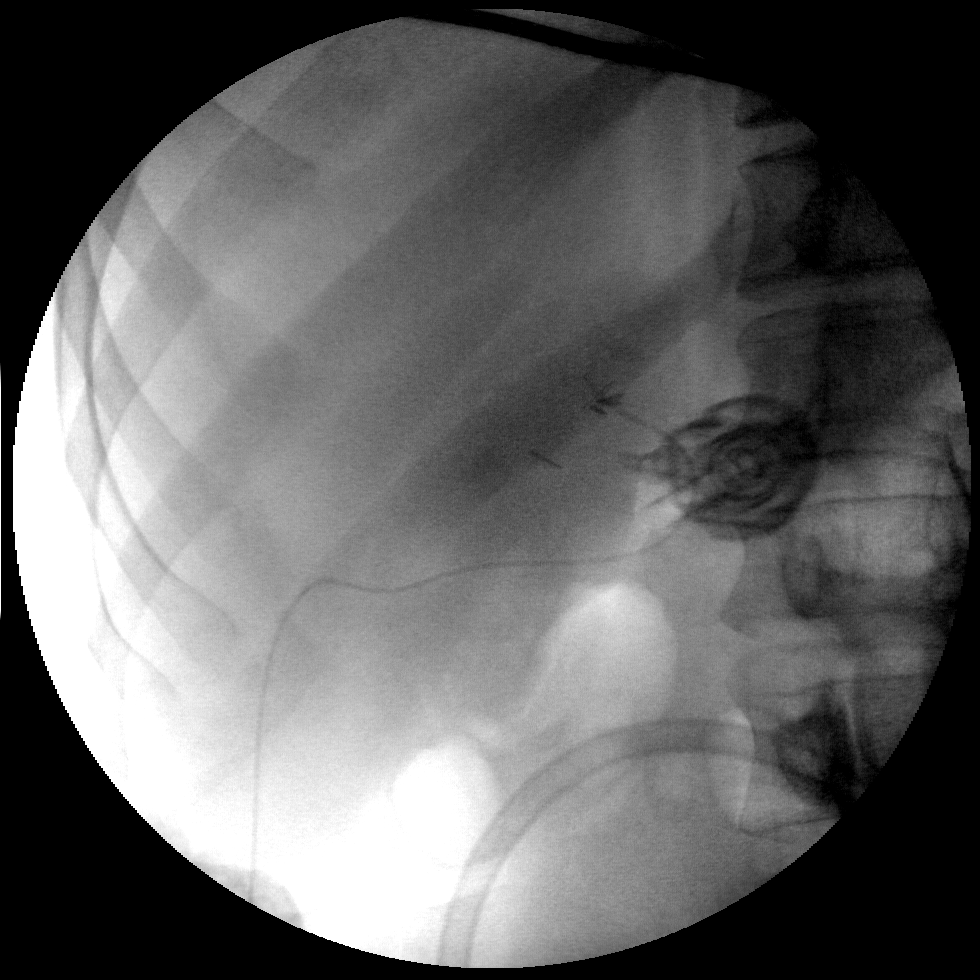
[frame 41/80]
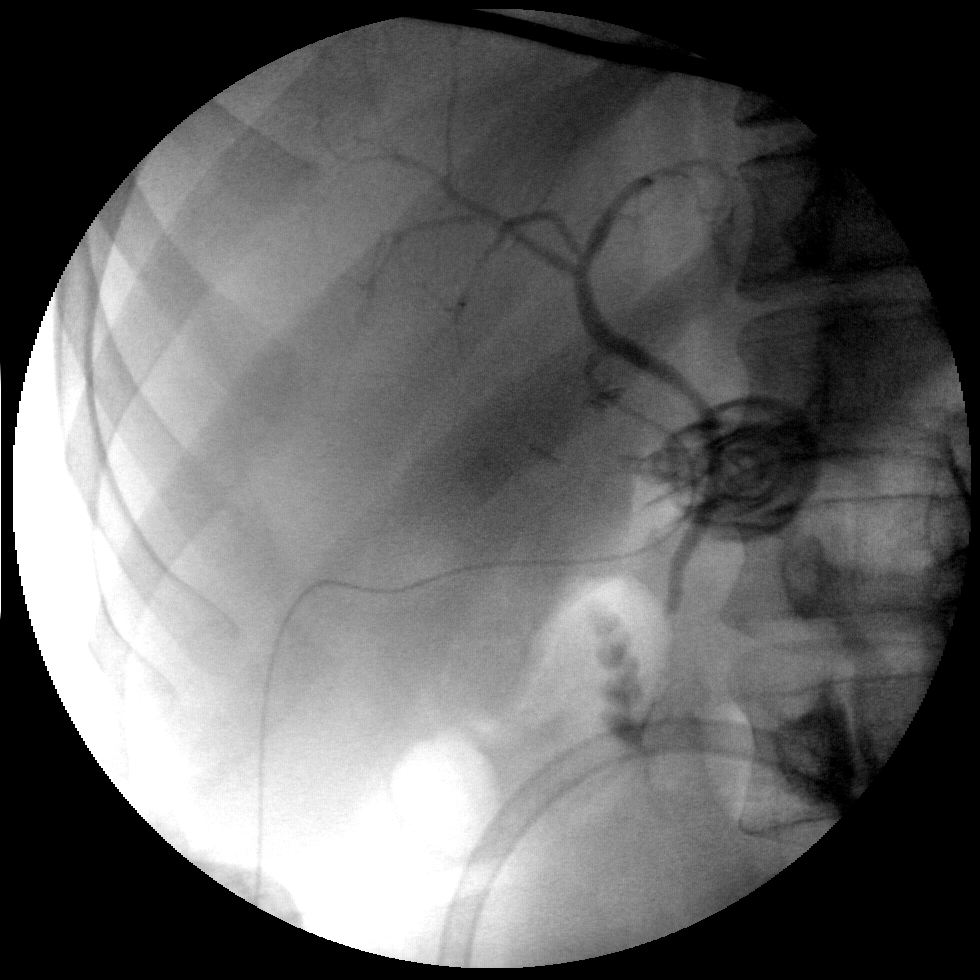
[frame 69/80]
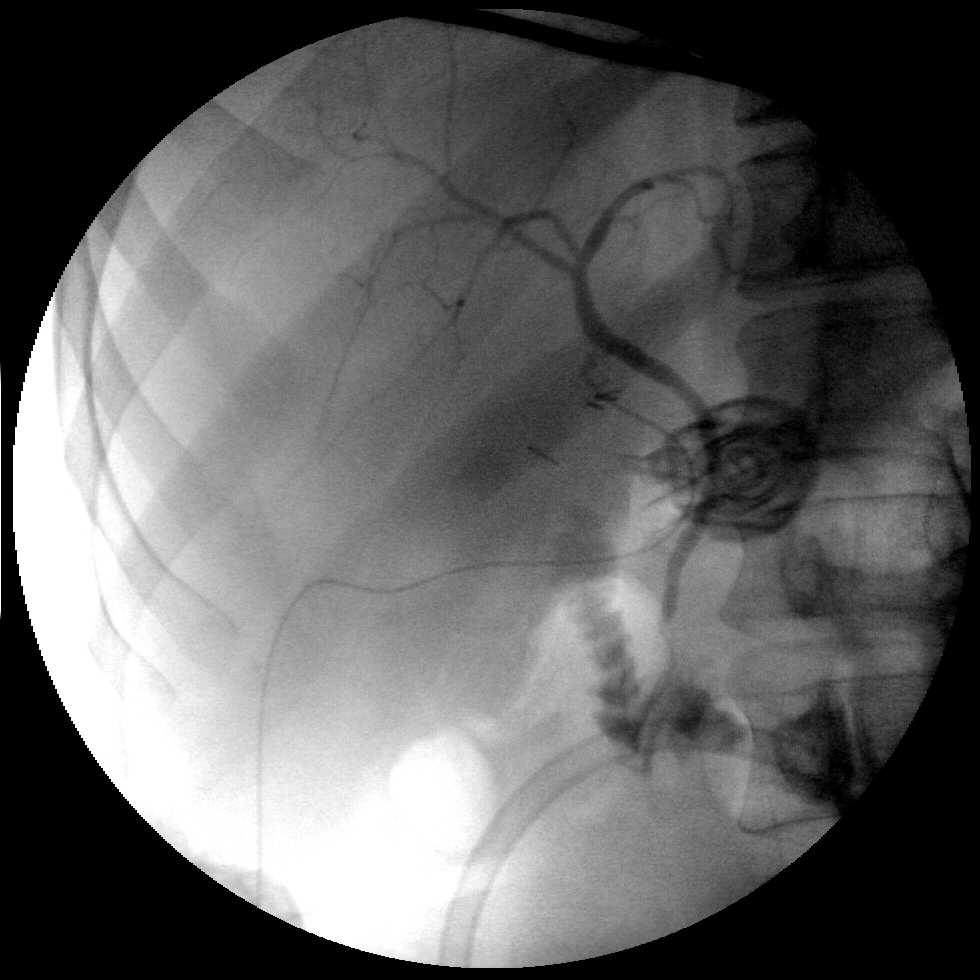
[frame 76/80]
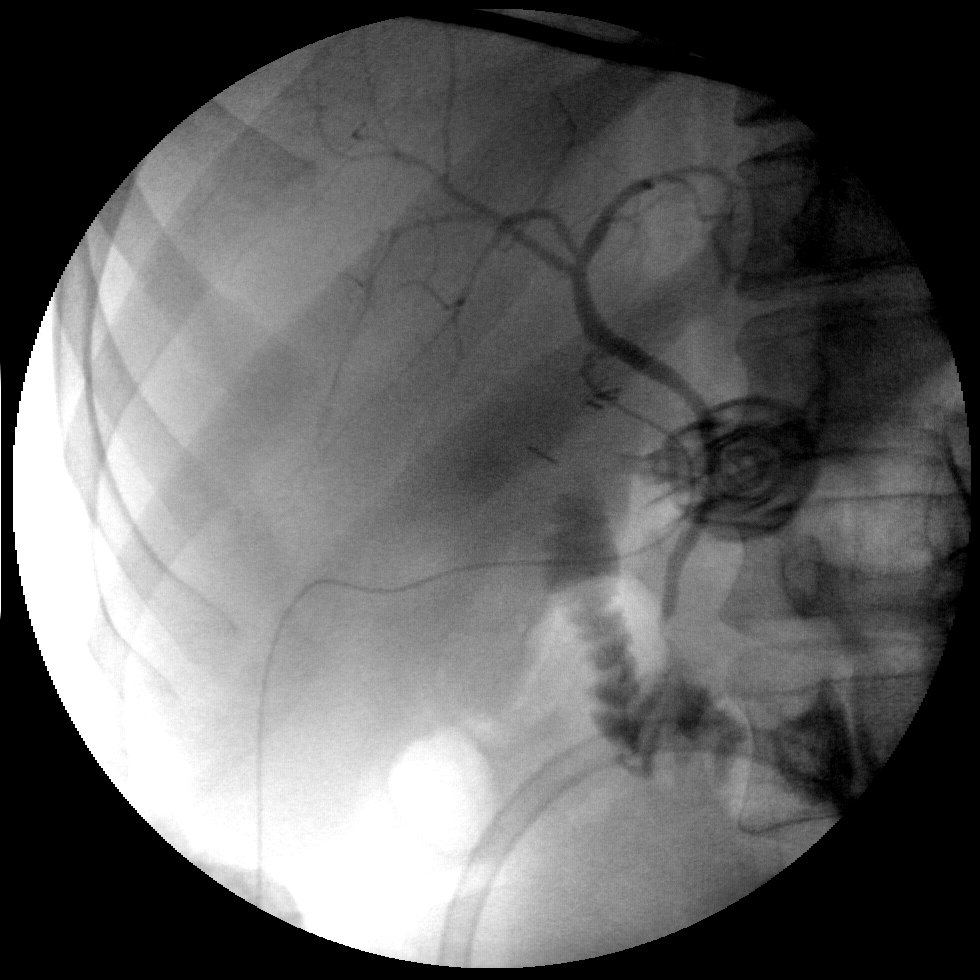

[4 of 4 positions shown; findings below may reference images not displayed]

FINDINGS: The gallbladder has been removed and the cystic duct
cannulated.  Contrast injection shows good opacification of the
biliary tree without filling defects or delay in excretion of
contrast into the duodenum.
IMPRESSION: Negative operative cholangiogram.

## 2014-01-14 ENCOUNTER — Other Ambulatory Visit: Payer: BC Managed Care – PPO

## 2014-01-21 ENCOUNTER — Encounter: Payer: BC Managed Care – PPO | Admitting: Internal Medicine

## 2014-04-30 ENCOUNTER — Ambulatory Visit: Payer: BC Managed Care – PPO | Admitting: Family Medicine

## 2014-04-30 ENCOUNTER — Encounter: Payer: Self-pay | Admitting: Family Medicine

## 2014-04-30 ENCOUNTER — Ambulatory Visit (INDEPENDENT_AMBULATORY_CARE_PROVIDER_SITE_OTHER): Payer: BC Managed Care – PPO | Admitting: Family Medicine

## 2014-04-30 VITALS — BP 110/80 | Temp 98.4°F | Wt 250.0 lb

## 2014-04-30 DIAGNOSIS — Z23 Encounter for immunization: Secondary | ICD-10-CM

## 2014-04-30 DIAGNOSIS — K7689 Other specified diseases of liver: Secondary | ICD-10-CM

## 2014-04-30 DIAGNOSIS — E782 Mixed hyperlipidemia: Secondary | ICD-10-CM

## 2014-04-30 DIAGNOSIS — K760 Fatty (change of) liver, not elsewhere classified: Secondary | ICD-10-CM

## 2014-04-30 DIAGNOSIS — D649 Anemia, unspecified: Secondary | ICD-10-CM

## 2014-04-30 HISTORY — DX: Anemia, unspecified: D64.9
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
## 2014-04-30 NOTE — Assessment & Plan Note (Addendum)
Presumed fatty liver as cause of LFT elevations but these are above 3x upper limit of normal. Plan to investigate further with next set of labs. ALT >5 times ULN where AST >3x ULN.   Alcoholic beverages approximately 4x per month but need to discuss further cutting down but doubt this is cause as ALT predominant. Doubt wilsons disease as labs were normal at age 38 just a few years ago and normal eye exam. Ferritin to eval hemachromatosis but doubt. Check TSH as well

## 2014-04-30 NOTE — Assessment & Plan Note (Signed)
Check cholesterol at next visit. Discussed need to minimize risk by improving diet and continuing to exercise.

## 2014-04-30 NOTE — Assessment & Plan Note (Signed)
Unclear etiology. Recheck cbc with next labs. Patient to come back fasting.

## 2014-04-30 NOTE — Patient Instructions (Addendum)
Come back in October for labs and we will make a plan. Big concerns in the long run are cholesterol as well as your liver function tests. Dr. Arnoldo Morale was worried about fatty liver and your weight is up 25 lbs since that time which is a concern.   I would recommend eating 3 meals a day (healthy snacks at work), and working on at least 5 servings of fruits/veggies per day. Vegetables as the focus. Try to take a lunch/snacks to work.   If the right arm becomes too much of an issue, we can send you to Dr. Charlann Boxer of Sports medicine  I think the left elbow is your olecranon bursa getting inflammed. Focus on heavy icing 20 minutes 3x a day when it flares.   May schedule your wife and step daughter at their earliest convenience. May be 3rd or 4th 30 minute appointment (may merge 2 slots) per half day.

## 2014-05-02 ENCOUNTER — Ambulatory Visit: Payer: BC Managed Care – PPO | Admitting: Family Medicine

## 2014-05-31 ENCOUNTER — Other Ambulatory Visit (INDEPENDENT_AMBULATORY_CARE_PROVIDER_SITE_OTHER): Payer: BC Managed Care – PPO

## 2014-05-31 DIAGNOSIS — E782 Mixed hyperlipidemia: Secondary | ICD-10-CM

## 2014-05-31 DIAGNOSIS — D649 Anemia, unspecified: Secondary | ICD-10-CM

## 2014-05-31 DIAGNOSIS — K760 Fatty (change of) liver, not elsewhere classified: Secondary | ICD-10-CM

## 2014-05-31 DIAGNOSIS — E785 Hyperlipidemia, unspecified: Secondary | ICD-10-CM

## 2014-05-31 LAB — COMPREHENSIVE METABOLIC PANEL
ALK PHOS: 68 U/L (ref 39–117)
ALT: 32 U/L (ref 0–53)
AST: 41 U/L — AB (ref 0–37)
Albumin: 4 g/dL (ref 3.5–5.2)
BILIRUBIN TOTAL: 0.9 mg/dL (ref 0.2–1.2)
BUN: 21 mg/dL (ref 6–23)
CO2: 25 mEq/L (ref 19–32)
CREATININE: 1.3 mg/dL (ref 0.4–1.5)
Calcium: 9.2 mg/dL (ref 8.4–10.5)
Chloride: 104 mEq/L (ref 96–112)
GFR: 63.5 mL/min (ref >60.00)
Glucose, Bld: 75 mg/dL (ref 70–99)
POTASSIUM: 3.7 meq/L (ref 3.5–5.1)
Sodium: 138 mEq/L (ref 135–145)
Total Protein: 7.4 g/dL (ref 6.0–8.3)

## 2014-05-31 LAB — CBC
HEMATOCRIT: 38.8 % — AB (ref 39.0–52.0)
HEMOGLOBIN: 13.2 g/dL (ref 13.0–17.0)
MCHC: 34.1 g/dL (ref 30.0–36.0)
MCV: 85.9 fl (ref 78.0–100.0)
PLATELETS: 230 10*3/uL (ref 150.0–400.0)
RBC: 4.52 Mil/uL (ref 4.22–5.81)
RDW: 12.9 % (ref 11.5–15.5)
WBC: 5.4 10*3/uL (ref 4.0–10.5)

## 2014-05-31 LAB — LDL CHOLESTEROL, DIRECT: Direct LDL: 138.3 mg/dL

## 2014-05-31 LAB — LIPID PANEL
CHOL/HDL RATIO: 5
CHOLESTEROL: 223 mg/dL — AB (ref 0–200)
HDL: 41.7 mg/dL (ref >39.00)
NONHDL: 181.3
TRIGLYCERIDES: 235 mg/dL — AB (ref 0.0–149.0)
VLDL: 47 mg/dL — ABNORMAL HIGH (ref 0.0–40.0)

## 2014-05-31 LAB — TSH: TSH: 1.02 u[IU]/mL (ref 0.35–4.50)

## 2014-05-31 LAB — FERRITIN: Ferritin: 74 ng/mL (ref 22.0–322.0)

## 2014-06-01 LAB — HEPATITIS A ANTIBODY, IGM: Hep A IgM: NONREACTIVE

## 2014-06-01 LAB — IRON AND TIBC
%SAT: 33 % (ref 20–55)
IRON: 104 ug/dL (ref 42–165)
TIBC: 315 ug/dL (ref 215–435)
UIBC: 211 ug/dL (ref 125–400)

## 2014-06-01 LAB — HEPATITIS A ANTIBODY, TOTAL: Hep A Total Ab: NONREACTIVE

## 2014-06-01 LAB — HEPATITIS B SURFACE ANTIGEN: HEP B S AG: NEGATIVE

## 2014-06-01 LAB — HEPATITIS B SURFACE ANTIBODY,QUALITATIVE: Hep B S Ab: NEGATIVE

## 2014-06-01 LAB — HEPATITIS C ANTIBODY, REFLEX: HCV Ab: NEGATIVE

## 2014-06-01 LAB — HEPATITIS B CORE ANTIBODY, TOTAL: HEP B C TOTAL AB: NONREACTIVE

## 2014-06-03 LAB — ANA: Anti Nuclear Antibody(ANA): NEGATIVE

## 2014-06-04 ENCOUNTER — Telehealth: Payer: Self-pay

## 2014-06-04 DIAGNOSIS — Z3009 Encounter for other general counseling and advice on contraception: Secondary | ICD-10-CM

## 2014-06-04 NOTE — Telephone Encounter (Signed)
Discuss with patient that this is planned to be permanent when done. If he is truly confident in this decision-you can refer him to urology for vasectomy/birth control.

## 2014-06-04 NOTE — Telephone Encounter (Signed)
Called and spoke with pt and pt verbalized understanding that this is permanent.  Pt still would like referral.  Referral placed and pt is aware he will receive a call to set up an appointment.

## 2014-06-04 NOTE — Telephone Encounter (Signed)
Called and spoke with pt about his lab results.  Pt states he forgot to mention has his visit that he is interested in a vasectomy.  Pls advise.

## 2014-07-02 HISTORY — PX: VASECTOMY: SHX75

## 2015-06-23 ENCOUNTER — Ambulatory Visit (INDEPENDENT_AMBULATORY_CARE_PROVIDER_SITE_OTHER): Payer: BC Managed Care – PPO | Admitting: Family Medicine

## 2015-06-23 ENCOUNTER — Encounter: Payer: Self-pay | Admitting: Family Medicine

## 2015-06-23 VITALS — BP 124/80 | HR 63 | Wt 261.0 lb

## 2015-06-23 DIAGNOSIS — Z23 Encounter for immunization: Secondary | ICD-10-CM

## 2015-06-23 DIAGNOSIS — M25512 Pain in left shoulder: Secondary | ICD-10-CM | POA: Diagnosis not present
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
## 2015-09-16 ENCOUNTER — Other Ambulatory Visit: Payer: Self-pay | Admitting: Sports Medicine

## 2017-06-15 ENCOUNTER — Encounter: Payer: Self-pay | Admitting: Family Medicine

## 2017-06-15 ENCOUNTER — Ambulatory Visit (INDEPENDENT_AMBULATORY_CARE_PROVIDER_SITE_OTHER): Payer: BC Managed Care – PPO | Admitting: Family Medicine

## 2017-06-15 VITALS — BP 116/82 | HR 59 | Temp 97.8°F | Ht 73.0 in | Wt 266.4 lb

## 2017-06-15 DIAGNOSIS — Z0000 Encounter for general adult medical examination without abnormal findings: Secondary | ICD-10-CM | POA: Diagnosis not present

## 2017-06-15 DIAGNOSIS — K760 Fatty (change of) liver, not elsewhere classified: Secondary | ICD-10-CM | POA: Diagnosis not present

## 2017-06-15 DIAGNOSIS — E785 Hyperlipidemia, unspecified: Secondary | ICD-10-CM | POA: Diagnosis not present

## 2017-06-15 DIAGNOSIS — Z23 Encounter for immunization: Secondary | ICD-10-CM

## 2017-06-15 LAB — COMPREHENSIVE METABOLIC PANEL
ALT: 50 U/L (ref 0–53)
AST: 49 U/L — ABNORMAL HIGH (ref 0–37)
Albumin: 4.9 g/dL (ref 3.5–5.2)
Alkaline Phosphatase: 69 U/L (ref 39–117)
BILIRUBIN TOTAL: 0.7 mg/dL (ref 0.2–1.2)
BUN: 15 mg/dL (ref 6–23)
CO2: 31 mEq/L (ref 19–32)
Calcium: 10 mg/dL (ref 8.4–10.5)
Chloride: 100 mEq/L (ref 96–112)
Creatinine, Ser: 1.32 mg/dL (ref 0.40–1.50)
GFR: 63.1 mL/min (ref >60.00)
GLUCOSE: 88 mg/dL (ref 70–99)
Potassium: 3.8 mEq/L (ref 3.5–5.1)
SODIUM: 140 meq/L (ref 135–145)
TOTAL PROTEIN: 7.9 g/dL (ref 6.0–8.3)

## 2017-06-15 LAB — POC URINALSYSI DIPSTICK (AUTOMATED)
BILIRUBIN UA: NEGATIVE
Blood, UA: NEGATIVE
GLUCOSE UA: NEGATIVE
Ketones, UA: NEGATIVE
Leukocytes, UA: NEGATIVE
NITRITE UA: NEGATIVE
Protein, UA: NEGATIVE
Spec Grav, UA: 1.015 (ref 1.010–1.025)
Urobilinogen, UA: 0.2 E.U./dL
pH, UA: 8 (ref 5.0–8.0)

## 2017-06-15 LAB — LDL CHOLESTEROL, DIRECT: LDL DIRECT: 121 mg/dL

## 2017-06-15 LAB — CBC
HCT: 42.5 % (ref 39.0–52.0)
Hemoglobin: 14.9 g/dL (ref 13.0–17.0)
MCHC: 35 g/dL (ref 30.0–36.0)
MCV: 86.9 fl (ref 78.0–100.0)
Platelets: 284 10*3/uL (ref 150.0–400.0)
RBC: 4.9 Mil/uL (ref 4.22–5.81)
RDW: 13.3 % (ref 11.5–15.5)
WBC: 5.5 10*3/uL (ref 4.0–10.5)

## 2017-06-15 LAB — LIPID PANEL
Cholesterol: 291 mg/dL — ABNORMAL HIGH (ref 0–200)
HDL: 40 mg/dL (ref >39.00)
Total CHOL/HDL Ratio: 7
Triglycerides: 623 mg/dL — ABNORMAL HIGH (ref 0.0–149.0)

## 2017-06-15 NOTE — Assessment & Plan Note (Addendum)
LFT elevation- improved on last check as below- has been thought to be fatty liver. Minimal alcohol about one a week. Update LFTs today Lab Results  Component Value Date   ALT 32 05/31/2014   AST 41 (H) 05/31/2014   ALKPHOS 68 05/31/2014   BILITOT 0.9 05/31/2014  Discussed importance of weight loss

## 2017-06-15 NOTE — Progress Notes (Signed)
Phone: [phone]  Subjective:  Patient presents today for their annual physical. Chief complaint-noted.   See problem oriented charting- ROS- full  review of systems was completed and negative except for: dental problems on amoxicllin right now per dentist  The following were reviewed and entered/updated in epic: Past Medical History:  Diagnosis Date  . Anemia 04/30/2014  . Hyperlipidemia   . Inguinal hernia   . Rotator cuff injury   . Syncope    after hernia surgery  . Umbilical hernia    Patient Active Problem List   Diagnosis Date Noted  . Fatty liver 04/30/2014    Priority: Medium  . Anemia 04/30/2014    Priority: Medium  . Hyperlipidemia, unspecified 04/30/2010    Priority: Medium  . Right inguinal hernia 11/29/2011    Priority: Low  . ROTATOR CUFF SYNDROME, RIGHT 04/30/2010    Priority: Low   Past Surgical History:  Procedure Laterality Date  . APPENDECTOMY  12/1993  . HERNIA REPAIR  12/06/2011   Right Inguinal    Family History  Problem Relation Age of Onset  . Hypertension Father   . Hyperlipidemia Brother        mother  . Heart disease Brother        congenital died at 21-sudden death    Medications- reviewed and updated Current Outpatient Medications  Medication Sig Dispense Refill  . amoxicillin (AMOXIL) 500 MG tablet Take 1 tablet 3 (three) times daily by mouth.  0   No current facility-administered medications for this visit.     Allergies-reviewed and updated No Known Allergies  Social History   Socioeconomic History  . Marital status: Single    Spouse name: None  . Number of children: None  . Years of education: None  . Highest education level: None  Social Needs  . Financial resource strain: None  . Food insecurity - worry: None  . Food insecurity - inability: None  . Transportation needs - medical: None  . Transportation needs - non-medical: None  Occupational History  . None  Tobacco Use  . Smoking status: Former Smoker    Packs/day: 1.00    Years: 11.00    Pack years: 11.00    Types: Cigarettes    Last attempt to quit: 06/03/2007    Years since quitting: 10.0  . Smokeless tobacco: Never Used  Substance and Sexual Activity  . Alcohol use: Yes    Comment: 4 per month  . Drug use: No    Comment: many years ago  . Sexual activity: No  Other Topics Concern  . None  Social History Narrative   Married 01/2014. 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       Hobbies: workout (aerobics and weights)    Objective: BP 116/82 (BP Location: Left Arm, Patient Position: Sitting, Cuff Size: Large)   Pulse (!) 59   Temp 97.8 F (36.6 C) (Oral)   Ht 6\' 1"  (1.854 m)   Wt 266 lb 6.4 oz (120.8 kg)   SpO2 98%   BMI 35.15 kg/m  Gen: NAD, resting comfortably, muscular build HEENT: Mucous membranes are moist. Oropharynx normal Neck: no thyromegaly CV: RRR no murmurs rubs or gallops Lungs: CTAB no crackles, wheeze, rhonchi Abdomen: soft/nontender/nondistended/normal bowel sounds. No rebound or guarding. obese Ext: no edema Skin: warm, dry Neuro: grossly normal, moves all extremities, PERRLA  Assessment/Plan:  41 y.o.  male presenting for annual physical.  Health Maintenance counseling: 1. Anticipatory guidance: Patient counseled regarding regular dental exams - has just started with regular dental care- has root canal planned next week, eye exams - yearly for contacts, wearing seatbelts.   2. Risk factor reduction:  Advised patient of need for regular exercise and diet rich and fruits and vegetables to reduce risk of heart attack and stroke. Exercise- still lifting weights usually doing late at night- 5-6 days a week, does want to do slightly better on cardio. Diet-knows he needs to work on this. cutting down on quick foods at home- potentially cutting down on sodas though they are somewhat rare about once a week.  Increasing veggies. Goal within a year of 250 or less. Discussed goal 225 long run with his extra muscle mass Wt Readings from Last 3 Encounters:  06/15/17 266 lb 6.4 oz (120.8 kg)  06/23/15 261 lb (118.4 kg)  04/30/14 250 lb (113.4 kg)  3. Immunizations/screening/ancillary studies- Tdap advised today . Flu shot today given. HIV screen has been completed through donating blood Immunization History  Administered Date(s) Administered  . Hepatitis B 09/30/2005, 07/02/2006, 04/08/2007  . Influenza Whole 08/03/2011  . Influenza,inj,Quad PF,6+ Mos 04/30/2014, 06/23/2015  . Td 03/01/2006  4. Prostate cancer screening-  no family history, start at age 77 likely  13. Colon cancer screening - no family history, start at age 10 or even as young as 34 6. Skin cancer screening/prevention- no dermatologist. advised regular sunscreen use- low exposure. Denies worrisome, changing, or new skin lesions.  7. Testicular cancer screening- advised monthly self exams  8. STD screening- patient opts out as monogomous  Status of chronic or acute concerns   Prior anemia- resolved on repeat labs- repeat cbc today  Intermittent shoulder issues- usually able to work around  Hyperlipidemia, unspecified HLD- LDL above goal as is total and triglycerides on last check update today. Healthy eating/regular exercise (he is pretty good abotu #2 advised)  Fatty liver LFT elevation- improved on last check as below- has been thought to be fatty liver. Minimal alcohol about one a week. Update LFTs today Lab Results  Component Value Date   ALT 32 05/31/2014   AST 41 (H) 05/31/2014   ALKPHOS 68 05/31/2014   BILITOT 0.9 05/31/2014  Discussed importance of weight loss  1 year physical  Orders Placed This Encounter  Procedures  . CBC    [place]  . Comprehensive metabolic panel    [place]    Order Specific Question:   Has the patient fasted?    Answer:   No  . Lipid panel    [place]    Order Specific Question:    Has the patient fasted?    Answer:   No  . POCT Urinalysis Dipstick (Automated)    Standing Status:   Future    Standing Expiration Date:   07/15/2017    Meds ordered this encounter  Medications  . amoxicillin (AMOXIL) 500 MG tablet    Sig: Take 1 tablet 3 (three) times daily by mouth.    Refill:  0   Return precautions advised.  Garret Reddish, MD

## 2017-06-15 NOTE — Addendum Note (Signed)
Addended by: Frutoso Chase A on: 06/15/2017 09:12 AM   Modules accepted: Orders

## 2017-06-15 NOTE — Addendum Note (Signed)
Addended by: Lyndle Herrlich on: 06/15/2017 08:59 AM   Modules accepted: Orders

## 2017-06-15 NOTE — Assessment & Plan Note (Signed)
HLD- LDL above goal as is total and triglycerides on last check update today. Healthy eating/regular exercise (he is pretty good abotu #2 advised)

## 2017-06-15 NOTE — Patient Instructions (Addendum)
Goal within a year of 250 or less. Long term goal 225.   Thanks for doing flu shot  Tdap today- good for 10 years  Please stop by lab before you go

## 2018-03-17 ENCOUNTER — Ambulatory Visit: Payer: BC Managed Care – PPO | Admitting: Family Medicine

## 2018-03-17 ENCOUNTER — Encounter: Payer: Self-pay | Admitting: Family Medicine

## 2018-03-17 VITALS — BP 122/80 | HR 64 | Temp 98.2°F | Ht 73.0 in | Wt 266.4 lb

## 2018-03-17 DIAGNOSIS — B9689 Other specified bacterial agents as the cause of diseases classified elsewhere: Secondary | ICD-10-CM | POA: Diagnosis not present

## 2018-03-17 DIAGNOSIS — J329 Chronic sinusitis, unspecified: Secondary | ICD-10-CM

## 2018-03-17 MED ORDER — AMOXICILLIN-POT CLAVULANATE 875-125 MG PO TABS: 1.0000 | ORAL_TABLET | Freq: Two times a day (BID) | ORAL | 0 refills | Status: AC

## 2018-03-17 MED ORDER — BENZONATATE 100 MG PO CAPS: 100.0000 mg | ORAL_CAPSULE | Freq: Two times a day (BID) | ORAL | 0 refills | Status: DC | PRN

## 2018-03-17 NOTE — Progress Notes (Signed)
PCP: Marin Olp, MD  Subjective:  Thomas Gilmore is a 42 y.o. year old very pleasant male patient who presents with sinusitis symptoms including nasal congestion, sinus tenderness -other symptoms include: Tickle, sore throat (now improed). A lot of phlegm. Feeling tired. Coughing fits. No fever, chills. No shortness of breath - more fatigued in a foot race. some frontal headaches -day of illness:16 d -Symptoms are improving slightly today -previous treatments: rest, hydration -sick contacts/travel/risks: Training in [place]. Could have had sick contacts -Hx of: allergies  ROS-denies fever, SOB, NVD, tooth pain  Pertinent Past Medical History-  Patient Active Problem List   Diagnosis Date Noted  . Fatty liver 04/30/2014    Priority: Medium  . Anemia 04/30/2014    Priority: Medium  . Hyperlipidemia, unspecified 04/30/2010    Priority: Medium  . Right inguinal hernia 11/29/2011    Priority: Low  . ROTATOR CUFF SYNDROME, RIGHT 04/30/2010    Priority: Low    Medications- reviewed  Current Outpatient Medications  Medication Sig Dispense Refill  . Multiple Vitamins-Minerals (CENTRUM ADULTS PO) Take 1 tablet by mouth daily.    Marland Kitchen amoxicillin-clavulanate (AUGMENTIN) 875-125 MG tablet Take 1 tablet by mouth 2 (two) times daily for 7 days. 14 tablet 0  . benzonatate (TESSALON) 100 MG capsule Take 1 capsule (100 mg total) by mouth 2 (two) times daily as needed for cough. 20 capsule 0   No current facility-administered medications for this visit.     Objective: BP 122/80 (BP Location: Left Arm, Patient Position: Sitting, Cuff Size: Large)   Pulse 64   Temp 98.2 F (36.8 C) (Oral)   Ht 6\' 1"  (1.854 m)   Wt 266 lb 6.1 oz (120.8 kg)   SpO2 96%   BMI 35.14 kg/m  Gen: NAD, resting comfortably HEENT: Turbinates erythematous with clear or green drainage, TM normal, pharynx mildly erythematous with no tonsilar exudate or edema, minimal sinus tenderness CV: RRR no  murmurs rubs or gallops Lungs: CTAB no crackles, wheeze, rhonchi Abdomen: soft/nontender/nondistended/obese Ext: no edema Skin: warm, dry, no rash Neuro: grossly normal, moves all extremities  Assessment/Plan:  Sinsusitis Bacterial based on: Symptoms >10 days  Treatment: -considered steroid: we opted out for now. We discussed could be post viral cough and prednisone may help but with continued sinus discharge more likely bacterial sinusitis - symptomatic care with tessalon (wants to suppress cough to help him sleep) -Antibiotic indicated: yes but since patient is improving today he wants to wait a few more days before taking antibiotic- we printed a script  For him.   Finally, we reviewed reasons to return to care including if symptoms worsen or persist or new concerns arise (particularly fever or shortness of breath)  Meds ordered this encounter  Medications  . benzonatate (TESSALON) 100 MG capsule    Sig: Take 1 capsule (100 mg total) by mouth 2 (two) times daily as needed for cough.    Dispense:  20 capsule    Refill:  0  . amoxicillin-clavulanate (AUGMENTIN) 875-125 MG tablet    Sig: Take 1 tablet by mouth 2 (two) times daily for 7 days.    Dispense:  14 tablet    Refill:  0    Garret Reddish, MD

## 2018-03-17 NOTE — Patient Instructions (Signed)
This could be an upper respiratory infection with lingering post viral cough. I lean more towards sinus infection with continued green drainage. Lets try a cough suppressant to try to help with nighttime cough at least. If you are not continuing to get better, we printed an antibiotic which you can take to your pharmacy to take.

## 2018-06-26 ENCOUNTER — Ambulatory Visit (INDEPENDENT_AMBULATORY_CARE_PROVIDER_SITE_OTHER): Payer: BC Managed Care – PPO | Admitting: Family Medicine

## 2018-06-26 ENCOUNTER — Encounter: Payer: Self-pay | Admitting: Family Medicine

## 2018-06-26 VITALS — BP 104/86 | HR 67 | Temp 97.7°F | Ht 73.0 in | Wt 264.8 lb

## 2018-06-26 DIAGNOSIS — Z23 Encounter for immunization: Secondary | ICD-10-CM

## 2018-06-26 DIAGNOSIS — E785 Hyperlipidemia, unspecified: Secondary | ICD-10-CM

## 2018-06-26 DIAGNOSIS — Z0000 Encounter for general adult medical examination without abnormal findings: Secondary | ICD-10-CM | POA: Diagnosis not present

## 2018-06-26 DIAGNOSIS — Z87891 Personal history of nicotine dependence: Secondary | ICD-10-CM

## 2018-06-26 LAB — POC URINALSYSI DIPSTICK (AUTOMATED)
BILIRUBIN UA: NEGATIVE
GLUCOSE UA: NEGATIVE
KETONES UA: NEGATIVE
Leukocytes, UA: NEGATIVE
Nitrite, UA: NEGATIVE
Protein, UA: NEGATIVE
RBC UA: NEGATIVE
Spec Grav, UA: 1.01 (ref 1.010–1.025)
UROBILINOGEN UA: 0.2 U/dL
pH, UA: 5.5 (ref 5.0–8.0)

## 2018-06-26 LAB — COMPREHENSIVE METABOLIC PANEL
ALT: 28 U/L (ref 0–53)
AST: 35 U/L (ref 0–37)
Albumin: 4.7 g/dL (ref 3.5–5.2)
Alkaline Phosphatase: 74 U/L (ref 39–117)
BILIRUBIN TOTAL: 0.7 mg/dL (ref 0.2–1.2)
BUN: 19 mg/dL (ref 6–23)
CALCIUM: 9.5 mg/dL (ref 8.4–10.5)
CO2: 25 meq/L (ref 19–32)
Chloride: 104 mEq/L (ref 96–112)
Creatinine, Ser: 1.22 mg/dL (ref 0.40–1.50)
GFR: 68.76 mL/min (ref >60.00)
Glucose, Bld: 84 mg/dL (ref 70–99)
Potassium: 3.8 mEq/L (ref 3.5–5.1)
SODIUM: 139 meq/L (ref 135–145)
Total Protein: 7.2 g/dL (ref 6.0–8.3)

## 2018-06-26 LAB — LDL CHOLESTEROL, DIRECT: Direct LDL: 136 mg/dL

## 2018-06-26 LAB — LIPID PANEL
CHOL/HDL RATIO: 6
CHOLESTEROL: 228 mg/dL — AB (ref 0–200)
HDL: 37.2 mg/dL — ABNORMAL LOW (ref >39.00)
NonHDL: 190.31
Triglycerides: 328 mg/dL — ABNORMAL HIGH (ref 0.0–149.0)
VLDL: 65.6 mg/dL — ABNORMAL HIGH (ref 0.0–40.0)

## 2018-06-26 LAB — CBC
HEMATOCRIT: 39.1 % (ref 39.0–52.0)
HEMOGLOBIN: 13.7 g/dL (ref 13.0–17.0)
MCHC: 35.1 g/dL (ref 30.0–36.0)
MCV: 84 fl (ref 78.0–100.0)
Platelets: 266 10*3/uL (ref 150.0–400.0)
RBC: 4.65 Mil/uL (ref 4.22–5.81)
RDW: 13.3 % (ref 11.5–15.5)
WBC: 5.7 10*3/uL (ref 4.0–10.5)

## 2018-06-26 NOTE — Patient Instructions (Addendum)
Health Maintenance Due  Topic Date Due  . INFLUENZA VACCINE - thanks for doing your flu shot today  03/02/2018   I like your idea of trying to do more meal preparation or snack preparation at home- I think that would be a great step for your long term health in working toward your goal of 250 lbs within next year and trying to help your triglycerides  Please stop by lab before you go

## 2018-06-26 NOTE — Addendum Note (Signed)
Addended by: Kayren Eaves T on: 06/26/2018 02:02 PM   Modules accepted: Orders

## 2018-06-26 NOTE — Progress Notes (Signed)
Phone: [phone]  Subjective:  Patient presents today for their annual physical. Chief complaint-noted.   See problem oriented charting- ROS- full  review of systems was completed and negative including No chest pain or shortness of breath. No headache or blurry vision.   The following were reviewed and entered/updated in epic: Past Medical History:  Diagnosis Date  . Anemia 04/30/2014  . Hyperlipidemia   . Inguinal hernia   . Rotator cuff injury   . Syncope    after hernia surgery  . Umbilical hernia    Patient Active Problem List   Diagnosis Date Noted  . Fatty liver 04/30/2014    Priority: Medium  . Anemia 04/30/2014    Priority: Medium  . Hyperlipidemia, unspecified 04/30/2010    Priority: Medium  . Right inguinal hernia 11/29/2011    Priority: Low  . ROTATOR CUFF SYNDROME, RIGHT 04/30/2010    Priority: Low   Past Surgical History:  Procedure Laterality Date  . APPENDECTOMY  12/1993  . CHOLECYSTECTOMY  12/16/2011   Procedure: LAPAROSCOPIC CHOLECYSTECTOMY WITH INTRAOPERATIVE CHOLANGIOGRAM;  Surgeon: Adin Hector, MD;  Location: [facility];  Service: General;  Laterality: N/A;  . HERNIA REPAIR  12/06/2011   Right Inguinal    Family History  Problem Relation Age of Onset  . Hypertension Father   . Hyperlipidemia Brother        mother  . Heart disease Brother        congenital died at 21-sudden death    Medications- reviewed and updated No current outpatient medications on file.   No current facility-administered medications for this visit.     Allergies-reviewed and updated No Known Allergies  Social History   Social History Narrative   Married 01/2014. 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       Hobbies: workout (aerobics and weights)    Objective: BP 104/86 (BP Location: Left Arm, Patient Position: Sitting, Cuff Size: Large)   Pulse 67   Temp 97.7  F (36.5 C) (Oral)   Ht 6\' 1"  (1.854 m)   Wt 264 lb 12.8 oz (120.1 kg)   SpO2 97%   BMI 34.94 kg/m  Gen: NAD, resting comfortably HEENT: Mucous membranes are moist. Oropharynx normal Neck: no thyromegaly CV: RRR no murmurs rubs or gallops Lungs: CTAB no crackles, wheeze, rhonchi Abdomen: soft/nontender/nondistended/normal bowel sounds.  Ext: no edema Skin: warm, dry Neuro: grossly normal, moves all extremities, PERRLA   Assessment/Plan:  42 y.o. male presenting for annual physical.  Health Maintenance counseling: 1. Anticipatory guidance: Patient counseled regarding regular dental exams - just last Monday and q6 months, eye exams - yearly due to contacts,  avoiding smoking and second hand smoke, limiting alcohol to 2 beverages per day- 2 or less per 3 months for him.   2. Risk factor reduction:  Advised patient of need for regular exercise and diet rich and fruits and vegetables to reduce risk of heart attack and stroke. Exercise- 6 days a week - hour of cardio and hour of lifting each day. Diet-avoids fried stuff, does some veggies. Does eat a fair amount due to work schedule. He thinks he could take food to work with him.  Wt Readings from Last 3 Encounters:  06/26/18 264 lb 12.8 oz (120.1 kg)  03/17/18 266 lb 6.1 oz (120.8 kg)  06/15/17 266 lb 6.4 oz (120.8 kg)  3. Immunizations/screenings/ancillary studies-flu shot  given today  Immunization History  Administered Date(s) Administered  . Hepatitis B 09/30/2005, 07/02/2006, 04/08/2007  . Influenza Whole 08/03/2011  . Influenza,inj,Quad PF,6+ Mos 04/30/2014, 06/23/2015, 06/15/2017  . Td 03/01/2006  . Tdap 06/15/2017  4. Prostate cancer screening- no family history started age 42 5. Colon cancer screening - no family history-start at age 31 likely. 6. Skin cancer screening/prevention-no dermatologist. advised regular sunscreen use. Denies worrisome, changing, or new skin lesions.  7. Testicular cancer screening- advised monthly  self exams  8. STD screening- patient opts out as monogamous 9.  Former smoker-quit in [place] pack years.  Will get urinalysis  Status of chronic or acute concerns   Fatty liver- mild LFT elevations in the past-have encouraged weight loss  Significant hypertriglyceridemia-update lipid panel.  Continue to work on healthy eating, regular exercise, weight loss.  Continued intermittent shoulder issues- no recent issues- has been able to do seated military press 185 lbs  1 year physical  Lab/Order associations:FASTING other than halloween size hershey bar at 11 Preventative health care - Plan: CBC, Lipid panel, Comprehensive metabolic panel, POCT Urinalysis Dipstick (Automated)  Hyperlipidemia, unspecified hyperlipidemia type - Plan: CBC, Lipid panel, Comprehensive metabolic panel  Former smoker - Plan: POCT Urinalysis Dipstick (Automated)  Return precautions advised.  Garret Reddish, MD

## 2018-06-26 NOTE — Addendum Note (Signed)
Addended by: Lucianne Lei M on: 06/26/2018 01:49 PM   Modules accepted: Orders

## 2019-03-26 ENCOUNTER — Ambulatory Visit (INDEPENDENT_AMBULATORY_CARE_PROVIDER_SITE_OTHER): Payer: BC Managed Care – PPO | Admitting: Family Medicine

## 2019-03-26 ENCOUNTER — Other Ambulatory Visit: Payer: Self-pay

## 2019-03-26 ENCOUNTER — Encounter: Payer: Self-pay | Admitting: Family Medicine

## 2019-03-26 DIAGNOSIS — Z20828 Contact with and (suspected) exposure to other viral communicable diseases: Secondary | ICD-10-CM

## 2019-03-26 DIAGNOSIS — B349 Viral infection, unspecified: Secondary | ICD-10-CM | POA: Diagnosis not present

## 2019-03-26 NOTE — Progress Notes (Signed)
     Virtual Visit via Video Note  Subjective  CC:  Chief Complaint  Patient presents with  . Possible COVID    Sxs started 8/22, Fever (highest 100.8), body aches, and more tired than usual.. Has tried Ny'Quil with minimal relief    Same day acute visit; PCP not available. New pt to me. Chart reviewed.   I connected with Thomas Gilmore on 03/26/19 at  4:00 PM EDT by a video enabled telemedicine application and verified that I am speaking with the correct person using two identifiers. Location patient: Home Location provider: [facility] [place], Office Persons participating in the virtual visit: Thomas Gilmore, Leamon Arnt, MD Thomas Gilmore, Thomas Gilmore discussed the limitations of evaluation and management by telemedicine and the availability of in person appointments. The patient expressed understanding and agreed to proceed. HPI: Thomas Gilmore is a 43 y.o. male who was contacted today to address the problems listed above in the chief complaint. . Viral infection sxs with low grade temps, myalgias and malaise with minimal cough. Decreased appetite but no n/v/d. No loss of taste or smell. No abdominal pain. No rash.   Assessment  1. Viral illness      Plan   Viral illness:  Stable. Education and counseling given. Supportive care. covid test in am. Discussed self quarantining and monitoring for worsening sxs. I discussed the assessment and treatment plan with the patient. The patient was provided an opportunity to ask questions and all were answered. The patient agreed with the plan and demonstrated an understanding of the instructions.   The patient was advised to call back or seek an in-person evaluation if the symptoms worsen or if the condition fails to improve as anticipated. Follow up: Return if symptoms worsen or fail to improve.  07/02/2019  No orders of the defined types were placed in this encounter.     I reviewed the patients updated PMH, FH, and  SocHx.    Patient Active Problem List   Diagnosis Date Noted  . Fatty liver 04/30/2014  . Anemia 04/30/2014  . Right inguinal hernia 11/29/2011  . Hyperlipidemia, unspecified 04/30/2010  . ROTATOR CUFF SYNDROME, RIGHT 04/30/2010   No outpatient medications have been marked as taking for the 03/26/19 encounter (Office Visit) with Leamon Arnt, MD.    Allergies: Patient has No Known Allergies. Family History: Patient family history includes Heart disease in his brother; Hyperlipidemia in his brother; Hypertension in his father. Social History:  Patient  reports that he quit smoking about 11 years ago. His smoking use included cigarettes. He has a 11.00 pack-year smoking history. He has never used smokeless tobacco. He reports current alcohol use. He reports that he does not use drugs.  Review of Systems: Constitutional: positive for fever malaise or anorexia Cardiovascular: negative for chest pain Respiratory: negative for SOB or persistent cough Gastrointestinal: negative for abdominal pain  OBJECTIVE Vitals: There were no vitals taken for this visit. currently afebrile General: no acute distress , A&Ox3, appears well  Leamon Arnt, MD

## 2019-03-26 NOTE — Patient Instructions (Signed)
Please follow up if symptoms do not improve or as needed.   We have put in an order for you to be tested for COVID-19.  You do not need an appointment to go get testing -- please proceed to one of the following drive-up testing locations at your convenience. The line closes at 3:30pm daily. Test results may take up to a week to return.  [facility] Location: [address], [place] (old [facility]) Hours: 8a-3:45p, M-F  [facility] Location: [address], [place], [place] [address] [facility]) Hours: 8a-3:45p, M-F  [facility] Location: [facility] (across from [facility]/[place]) Hours: 8a-3:45p, Mon, Tue, Thur, Fri --[place]

## 2019-03-27 ENCOUNTER — Other Ambulatory Visit: Payer: Self-pay

## 2019-03-27 DIAGNOSIS — Z20822 Contact with and (suspected) exposure to covid-19: Secondary | ICD-10-CM

## 2019-03-28 LAB — NOVEL CORONAVIRUS, NAA: SARS-CoV-2, NAA: DETECTED — AB

## 2019-03-29 ENCOUNTER — Encounter: Payer: Self-pay | Admitting: Family Medicine
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
## 2019-04-10 ENCOUNTER — Encounter: Payer: Self-pay | Admitting: Family Medicine

## 2019-04-11 ENCOUNTER — Encounter: Payer: Self-pay | Admitting: Family Medicine

## 2019-04-12 ENCOUNTER — Telehealth: Payer: Self-pay | Admitting: Physical Therapy

## 2019-04-12 NOTE — Telephone Encounter (Signed)
Copied from [place] [id number]. Topic: Appointment Scheduling - Scheduling Inquiry for Clinic >> Apr 12, 2019  9:07 AM Sheran Luz wrote: Patient calling to inquire if Dr. Yong Channel would be willing to fit him in for a virtual visit for [facility] paperwork. Offered patient an appointment with Dr. Rogers Blocker but patient states he feels as if it has to be Dr. Yong Channel due to nature of the paperwork for his employer.

## 2019-04-12 NOTE — Telephone Encounter (Signed)
Please contact pt and office first available visit with Dr. Yong Channel.  Thanks!

## 2019-04-13 ENCOUNTER — Telehealth: Payer: Self-pay

## 2019-04-13 NOTE — Telephone Encounter (Signed)
  Per front office staff via Skype "[id number] so this patient yall sent him a letter on my chart and it needs to say yall released on the 8th to back to work.        Spoke to pt and advised that the letter was changed. Pt stated he will look to see if the letter was correct. Pt stated he will call back if needed.

## 2019-07-02 ENCOUNTER — Encounter: Payer: BC Managed Care – PPO | Admitting: Family Medicine

## 2020-03-05 ENCOUNTER — Other Ambulatory Visit: Payer: Self-pay

## 2020-03-05 ENCOUNTER — Ambulatory Visit (INDEPENDENT_AMBULATORY_CARE_PROVIDER_SITE_OTHER): Payer: BC Managed Care – PPO

## 2020-03-05 DIAGNOSIS — Z111 Encounter for screening for respiratory tuberculosis: Secondary | ICD-10-CM | POA: Diagnosis not present

## 2020-03-05 NOTE — Progress Notes (Signed)
Per orders of Dr. Rowan Blase placement  given by Francella Solian in left arm . Patient tolerated injection well. Patient will make appointment for 2 day to have read.  He as office visit on Friday with Dr Yong Channel we will read at that time

## 2020-03-06 NOTE — Patient Instructions (Addendum)
Health Maintenance Due  Topic Date Due  . COVID-19 Vaccine (1) Never done  . INFLUENZA VACCINE  03/02/2020

## 2020-03-06 NOTE — Progress Notes (Signed)
Patient only needed PPD before completed-no visit was needed-erroneously scheduled with me.  No charge

## 2020-03-07 ENCOUNTER — Ambulatory Visit (INDEPENDENT_AMBULATORY_CARE_PROVIDER_SITE_OTHER): Payer: BC Managed Care – PPO

## 2020-03-07 ENCOUNTER — Other Ambulatory Visit: Payer: Self-pay

## 2020-03-07 ENCOUNTER — Ambulatory Visit: Payer: BC Managed Care – PPO | Admitting: Family Medicine

## 2020-03-07 ENCOUNTER — Encounter: Payer: Self-pay | Admitting: Family Medicine

## 2020-03-07 DIAGNOSIS — Z23 Encounter for immunization: Secondary | ICD-10-CM

## 2020-03-07 LAB — TB SKIN TEST
Induration: 0 mm
TB Skin Test: NEGATIVE

## 2020-06-18 NOTE — Progress Notes (Signed)
Phone: [phone]    Subjective:  Patient presents today for their annual physical. Chief complaint-noted.   See problem oriented charting- ROS- full  review of systems was completed and negative  except for: fatigue, joint pain, back pain, muscle aches  The following were reviewed and entered/updated in epic: Past Medical History:  Diagnosis Date  . Anemia 04/30/2014  . Hyperlipidemia   . Inguinal hernia   . Rotator cuff injury   . Syncope    after hernia surgery  . Umbilical hernia    Patient Active Problem List   Diagnosis Date Noted  . Fatty liver 04/30/2014    Priority: Medium  . Anemia 04/30/2014    Priority: Medium  . Hyperlipidemia, unspecified 04/30/2010    Priority: Medium  . Right inguinal hernia 11/29/2011    Priority: Low  . ROTATOR CUFF SYNDROME, RIGHT 04/30/2010    Priority: Low   Past Surgical History:  Procedure Laterality Date  . APPENDECTOMY  12/1993  . CHOLECYSTECTOMY  12/16/2011   Procedure: LAPAROSCOPIC CHOLECYSTECTOMY WITH INTRAOPERATIVE CHOLANGIOGRAM;  Surgeon: Adin Hector, MD;  Location: [facility];  Service: General;  Laterality: N/A;  . HERNIA REPAIR  12/06/2011   Right Inguinal  . VASECTOMY  07/2014    Family History  Problem Relation Age of Onset  . Hypertension Father   . Hyperlipidemia Brother        mother  . Heart disease Brother        congenital died at 21-sudden death    Medications- reviewed and updated No current outpatient medications on file.   No current facility-administered medications for this visit.    Allergies-reviewed and updated No Known Allergies  Social History   Social History Narrative   Married 01/2014. 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       Hobbies: workout (aerobics and weights)      Objective:  BP 118/80   Pulse 69   Temp 98.1 F (36.7 C) (Temporal)   Resp 18   Ht _0  (1.854 m)    Wt 274 lb 3.2 oz (124.4 kg)   SpO2 97%   BMI 36.18 kg/m  Gen: NAD, resting comfortably HEENT: Mucous membranes are moist. Oropharynx normal Neck: no thyromegaly CV: RRR no murmurs rubs or gallops Lungs: CTAB no crackles, wheeze, rhonchi Abdomen: soft/nontender/nondistended/normal bowel sounds. No rebound or guarding.  Ext: no edema Skin: warm, dry Neuro: grossly normal, moves all extremities, PERRLA    Assessment and Plan:  44 y.o. male presenting for annual physical.  Health Maintenance counseling: 1. Anticipatory guidance: Patient counseled regarding regular dental exams q6 months normally but looking for new dentist- has had some issues, eye exams- yearly,  avoiding smoking and second hand smoke, limiting alcohol to 2 beverages per day.   2. Risk factor reduction:  Advised patient of need for regular exercise and diet rich and fruits and vegetables to reduce risk of heart attack and stroke. Exercise- 5-6 days a week at the gym. Diet-up 10 lbs from last visit- such as not eating frosted flakes for breakfast. Feels he needs to have healthier options for lunch. Could cut down on rare soda but states not a major issue for him. Does have a sweet tooth. Calorie counting doesn't work well for him.  Wt Readings from Last 3 Encounters:  06/20/20 274 lb 3.2 oz (124.4 kg)  06/26/18 264 lb 12.8  oz (120.1 kg)  03/17/18 266 lb 6.1 oz (120.8 kg)  3. Immunizations/screenings/ancillary studies- flu shot today  Immunization History  Administered Date(s) Administered  . Hepatitis B 09/30/2005, 07/02/2006, 04/08/2007  . Influenza Whole 08/03/2011  . Influenza,inj,Quad PF,6+ Mos 04/30/2014, 06/23/2015, 06/15/2017, 06/26/2018  . MMR 03/07/2020  . PFIZER SARS-COV-2 Vaccination 11/14/2019, 12/03/2019  . PPD Test 03/05/2020  . Td 03/01/2006  . Tdap 06/15/2017  . Varicella 03/07/2020  4. Prostate cancer screening- no family history, start at age 73 5. Colon cancer screening - refer for colonoscopy at  age 79- asked him to make sure covered 6. Skin cancer screening/prevention- no dermatologistadvised regular sunscreen use. Denies worrisome, changing, or new skin lesions- stable slight discoloration on mid chest for months- he will let us know if it changes 7. Testicular cancer screening- advised monthly self exams  8. STD screening- patient opts monogamous  9. Former smoker- quit in 2008 with 11 pack years- will get UA with labs  Status of chronic or acute concerns   # Fatigue- patient with ongoing fatigue since covid 21 March 2019. Also wonder if age, stressors, work schedule contributing Can have calm easy day at work and be exhausted by evening time and fall asleep on the couch. Wife thinks he is working more early mornings leaving house at 3 AM for 3-4 days a week. Used to be able to knock out days like that without issues. No chest pain or shortness of breath. Wife reports he has started snoring in last few years. Denies brain fog but wife states he repeats himself a few times.  -check bloodwork for obvious causes -if none found consider sleep apnea testing -this could be covid 19 long haul syndrome- he does not want to try medication at this time which is reaosnable  #hyperlipidemia S: Medication: none  Lab Results  Component Value Date   CHOL 228 (H) 06/26/2018   HDL 37.20 (L) 06/26/2018   LDLCALC 124 (H) 07/10/2010   LDLDIRECT 136.0 06/26/2018   TRIG 328.0 (H) 06/26/2018   CHOLHDL 6 06/26/2018   A/P: elevated in the past- update lipid panel- likely work on lifestyle over starting medicine. 10 year ascvd risk only 3.2 % low risk so remain off statin  Recommended follow up: Return in about 1 year (around 06/20/2021) for physical or sooner if needed such as fatigue worsening or new symptoms.  Lab/Order associations:non fasting   ICD-10-CM   1. Preventative health care  Q65.78 COMPLETE METABOLIC PANEL WITH GFR    CBC with Differential/Platelet    Lipid panel    TSH    Vitamin  B12    VITAMIN D 25 Hydroxy (Vit-D Deficiency, Fractures)    POCT Urinalysis Dipstick (Automated)  2. Hyperlipidemia, unspecified hyperlipidemia type  I69.6 COMPLETE METABOLIC PANEL WITH GFR    CBC with Differential/Platelet    Lipid panel  3. Screen for colon cancer  Z12.11 Ambulatory referral to Gastroenterology  4. Fatigue, unspecified type  R53.83 TSH    Vitamin B12    VITAMIN D 25 Hydroxy (Vit-D Deficiency, Fractures)  5. Former smoker  Z87.891 POCT Urinalysis Dipstick (Automated)    No orders of the defined types were placed in this encounter.   Return precautions advised.   Garret Reddish, MD

## 2020-06-18 NOTE — Patient Instructions (Addendum)
  Health Maintenance Due  Topic Date Due  . INFLUENZA VACCINE In office flu shot today. 03/02/2020   Schedule a lab visit at the check out desk within 2 weeks. Return for future fasting labs meaning nothing but water after midnight please. Ok to take your medications with water.   Please stop by lab before you go- just the urine today If you have mychart- we will send your results within 3 business days of [place] receiving them.  If you do not have mychart- we will call you about results within 5 business days of [place] receiving them.  *please note we are currently using [facility] which has a longer processing time than [place] typically so labs may not come back as quickly as in the past *please also note that you will see labs on mychart as soon as they post. I will later go in and write notes on them- will say "notes from Dr. Yong Channel"

## 2020-06-20 ENCOUNTER — Ambulatory Visit (INDEPENDENT_AMBULATORY_CARE_PROVIDER_SITE_OTHER): Payer: BC Managed Care – PPO | Admitting: Family Medicine

## 2020-06-20 ENCOUNTER — Other Ambulatory Visit: Payer: Self-pay

## 2020-06-20 ENCOUNTER — Encounter: Payer: Self-pay | Admitting: Family Medicine

## 2020-06-20 VITALS — BP 118/80 | HR 69 | Temp 98.1°F | Resp 18 | Ht 73.0 in | Wt 274.2 lb

## 2020-06-20 DIAGNOSIS — R5383 Other fatigue: Secondary | ICD-10-CM

## 2020-06-20 DIAGNOSIS — E785 Hyperlipidemia, unspecified: Secondary | ICD-10-CM | POA: Diagnosis not present

## 2020-06-20 DIAGNOSIS — Z0000 Encounter for general adult medical examination without abnormal findings: Secondary | ICD-10-CM | POA: Diagnosis not present

## 2020-06-20 DIAGNOSIS — Z23 Encounter for immunization: Secondary | ICD-10-CM | POA: Diagnosis not present

## 2020-06-20 DIAGNOSIS — Z87891 Personal history of nicotine dependence: Secondary | ICD-10-CM

## 2020-06-20 DIAGNOSIS — Z1211 Encounter for screening for malignant neoplasm of colon: Secondary | ICD-10-CM | POA: Diagnosis not present

## 2020-06-20 LAB — POC URINALSYSI DIPSTICK (AUTOMATED)
Bilirubin, UA: NEGATIVE
Blood, UA: NEGATIVE
Glucose, UA: NEGATIVE
Ketones, UA: NEGATIVE
Leukocytes, UA: NEGATIVE
Nitrite, UA: NEGATIVE
Protein, UA: NEGATIVE
Spec Grav, UA: 1.025 (ref 1.010–1.025)
Urobilinogen, UA: 0.2 E.U./dL
pH, UA: 6 (ref 5.0–8.0)

## 2020-06-20 NOTE — Addendum Note (Signed)
Addended by: Thomes Cake on: 06/20/2020 04:28 PM   Modules accepted: Orders

## 2020-06-24 ENCOUNTER — Other Ambulatory Visit: Payer: BC Managed Care – PPO

## 2020-06-24 DIAGNOSIS — E785 Hyperlipidemia, unspecified: Secondary | ICD-10-CM

## 2020-06-24 DIAGNOSIS — R5383 Other fatigue: Secondary | ICD-10-CM

## 2020-06-24 DIAGNOSIS — Z0000 Encounter for general adult medical examination without abnormal findings: Secondary | ICD-10-CM

## 2020-06-24 LAB — COMPLETE METABOLIC PANEL WITH GFR
AG Ratio: 1.7 (calc) (ref 1.0–2.5)
ALT: 42 U/L (ref 9–46)
AST: 38 U/L (ref 10–40)
Albumin: 4.5 g/dL (ref 3.6–5.1)
Alkaline phosphatase (APISO): 75 U/L (ref 36–130)
BUN: 22 mg/dL (ref 7–25)
CO2: 28 mmol/L (ref 20–32)
Calcium: 9.5 mg/dL (ref 8.6–10.3)
Chloride: 104 mmol/L (ref 98–110)
Creat: 1.3 mg/dL (ref 0.60–1.35)
GFR, Est African American: 76 mL/min/{1.73_m2} (ref >=60)
GFR, Est Non African American: 66 mL/min/{1.73_m2} (ref >=60)
Globulin: 2.6 g/dL (calc) (ref 1.9–3.7)
Glucose, Bld: 97 mg/dL (ref 65–99)
Potassium: 4.1 mmol/L (ref 3.5–5.3)
Sodium: 140 mmol/L (ref 135–146)
Total Bilirubin: 0.6 mg/dL (ref 0.2–1.2)
Total Protein: 7.1 g/dL (ref 6.1–8.1)

## 2020-06-24 LAB — CBC WITH DIFFERENTIAL/PLATELET
Absolute Monocytes: 434 cells/uL (ref 200–950)
Basophils Absolute: 31 cells/uL (ref 0–200)
Basophils Relative: 0.6 %
Eosinophils Absolute: 240 cells/uL (ref 15–500)
Eosinophils Relative: 4.7 %
HCT: 38.7 % (ref 38.5–50.0)
Hemoglobin: 13.6 g/dL (ref 13.2–17.1)
Lymphs Abs: 1612 cells/uL (ref 850–3900)
MCH: 30.4 pg (ref 27.0–33.0)
MCHC: 35.1 g/dL (ref 32.0–36.0)
MCV: 86.6 fL (ref 80.0–100.0)
MPV: 10.1 fL (ref 7.5–12.5)
Monocytes Relative: 8.5 %
Neutro Abs: 2785 cells/uL (ref 1500–7800)
Neutrophils Relative %: 54.6 %
Platelets: 245 10*3/uL (ref 140–400)
RBC: 4.47 10*6/uL (ref 4.20–5.80)
RDW: 12.9 % (ref 11.0–15.0)
Total Lymphocyte: 31.6 %
WBC: 5.1 10*3/uL (ref 3.8–10.8)

## 2020-06-24 LAB — TSH: TSH: 4.43 mIU/L (ref 0.40–4.50)

## 2020-06-24 LAB — LIPID PANEL
Cholesterol: 235 mg/dL — ABNORMAL HIGH (ref <200)
HDL: 40 mg/dL (ref >=40)
LDL Cholesterol (Calc): 145 mg/dL (calc) — ABNORMAL HIGH
Non-HDL Cholesterol (Calc): 195 mg/dL (calc) — ABNORMAL HIGH (ref <130)
Total CHOL/HDL Ratio: 5.9 (calc) — ABNORMAL HIGH (ref <5.0)
Triglycerides: 324 mg/dL — ABNORMAL HIGH (ref <150)

## 2020-06-24 LAB — VITAMIN B12: Vitamin B-12: 752 pg/mL (ref 200–1100)

## 2020-06-24 LAB — VITAMIN D 25 HYDROXY (VIT D DEFICIENCY, FRACTURES): Vit D, 25-Hydroxy: 19 ng/mL — ABNORMAL LOW (ref 30–100)

## 2020-06-26 ENCOUNTER — Other Ambulatory Visit: Payer: Self-pay | Admitting: Family Medicine

## 2020-06-26 MED ORDER — VITAMIN D (ERGOCALCIFEROL) 1.25 MG (50000 UNIT) PO CAPS: 50000.0000 [IU] | ORAL_CAPSULE | ORAL | 0 refills | Status: DC

## 2020-09-18 ENCOUNTER — Ambulatory Visit (AMBULATORY_SURGERY_CENTER): Payer: BC Managed Care – PPO

## 2020-09-18 ENCOUNTER — Other Ambulatory Visit: Payer: Self-pay

## 2020-09-18 VITALS — Ht 73.0 in | Wt 265.0 lb

## 2020-09-18 DIAGNOSIS — Z1211 Encounter for screening for malignant neoplasm of colon: Secondary | ICD-10-CM

## 2020-09-18 MED ORDER — PLENVU 140 G PO SOLR: 1.0000 | ORAL | 0 refills | Status: DC

## 2020-09-18 NOTE — Progress Notes (Signed)
Pt verified name, DOB, address and insurance during PV today.   Pt mailed instruction packet to included paper to complete and mail back to [facility] with addressed and stamped envelope, Emmi video, copy of consent form to read and not return, and instructions. Plenvu coupon mailed in packet. PV completed over the phone. Pt encouraged to call with questions or issues   Self wt report: 256   No allergies to soy or egg Pt is not on blood thinners or diet pills Denies issues with sedation/intubation Denies atrial flutter/fib Denies constipation   Emmi instructions given to pt  Pt is aware of Covid safety and care partner requirements.

## 2020-09-26 ENCOUNTER — Encounter: Payer: Self-pay | Admitting: Gastroenterology

## 2020-10-02 ENCOUNTER — Encounter: Payer: Self-pay | Admitting: Gastroenterology

## 2020-10-02 ENCOUNTER — Ambulatory Visit (AMBULATORY_SURGERY_CENTER): Payer: BC Managed Care – PPO | Admitting: Gastroenterology

## 2020-10-02 ENCOUNTER — Other Ambulatory Visit: Payer: Self-pay

## 2020-10-02 VITALS — BP 94/69 | HR 67 | Temp 97.7°F | Resp 15 | Ht 73.0 in | Wt 256.0 lb

## 2020-10-02 DIAGNOSIS — D120 Benign neoplasm of cecum: Secondary | ICD-10-CM | POA: Diagnosis not present

## 2020-10-02 DIAGNOSIS — D122 Benign neoplasm of ascending colon: Secondary | ICD-10-CM | POA: Diagnosis not present

## 2020-10-02 DIAGNOSIS — D124 Benign neoplasm of descending colon: Secondary | ICD-10-CM | POA: Diagnosis not present

## 2020-10-02 DIAGNOSIS — D125 Benign neoplasm of sigmoid colon: Secondary | ICD-10-CM

## 2020-10-02 DIAGNOSIS — D123 Benign neoplasm of transverse colon: Secondary | ICD-10-CM

## 2020-10-02 DIAGNOSIS — Z1211 Encounter for screening for malignant neoplasm of colon: Secondary | ICD-10-CM | POA: Diagnosis not present

## 2020-10-02 MED ORDER — SODIUM CHLORIDE 0.9 % IV SOLN: 500.0000 mL | Freq: Once | INTRAVENOUS | Status: DC

## 2020-10-02 NOTE — Progress Notes (Signed)
Vs by Red Christians RN in adm  Pt's states no medical or surgical changes since previsit or office visit.

## 2020-10-02 NOTE — Op Note (Signed)
[place] Patient Name: Thomas Gilmore Procedure Date: 10/02/2020 10:34 AM MRN: [id number] Endoscopist: Mallie Mussel L. Loletha Carrow , MD Age: 45 Referring MD:  Date of Birth: Sep 01, 1975 Gender: Male Account #: [id number] Procedure:                Colonoscopy Indications:              Screening for colorectal malignant neoplasm, This                            is the patient's first colonoscopy Medicines:                Monitored Anesthesia Care Procedure:                Pre-Anesthesia Assessment:                           - Prior to the procedure, a History and Physical                            was performed, and patient medications and                            allergies were reviewed. The patient's tolerance of                            previous anesthesia was also reviewed. The risks                            and benefits of the procedure and the sedation                            options and risks were discussed with the patient.                            All questions were answered, and informed consent                            was obtained. Prior Anticoagulants: The patient has                            taken no previous anticoagulant or antiplatelet                            agents. ASA Grade Assessment: II - A patient with                            mild systemic disease. After reviewing the risks                            and benefits, the patient was deemed in                            satisfactory condition to undergo the procedure.  After obtaining informed consent, the colonoscope                            was passed under direct vision. Throughout the                            procedure, the patient's blood pressure, pulse, and                            oxygen saturations were monitored continuously. The                            Olympus CF-HQ190L ([id number]) Colonoscope was                            introduced through the anus and  advanced to the the                            cecum, identified by appendiceal orifice and                            ileocecal valve. The colonoscopy was performed                            without difficulty. The patient tolerated the                            procedure well. The quality of the bowel                            preparation was excellent. The ileocecal valve,                            appendiceal orifice, and rectum were photographed.                            The bowel preparation used was Plenvu. Scope In: 10:41:21 AM Scope Out: 11:13:42 AM Scope Withdrawal Time: 0 hours 28 minutes 1 second  Total Procedure Duration: 0 hours 32 minutes 21 seconds  Findings:                 The perianal and digital rectal examinations were                            normal.                           Four sessile polyps were found in the transverse                            colon, ascending colon and ileocecal valve. The                            polyps were 3 to 8 mm in size. These polyps were  removed with a cold snare. Resection and retrieval                            were complete.                           A 12 mm polyp was found in the transverse colon.                            The polyp was semi-sessile. The polyp was removed                            with a piecemeal technique using a cold snare.                            Resection and retrieval were complete.                           Three sessile polyps were found in the descending                            colon and transverse colon. The polyps were 4 to 6                            mm in size. These polyps were removed with a cold                            snare. Resection and retrieval were complete.                           Two sessile polyps were found in the sigmoid colon                            and descending colon. The polyps were 4 to 6 mm in                            size.  These polyps were removed with a cold snare.                            Resection and retrieval were complete.                           The exam was otherwise without abnormality on                            direct and retroflexion views. Complications:            No immediate complications. Estimated Blood Loss:     Estimated blood loss was minimal. Impression:               - Four 3 to 8 mm polyps in the transverse colon, in  the ascending colon and at the ileocecal valve,                            removed with a cold snare. Resected and retrieved.                           - One 12 mm polyp in the transverse colon, removed                            piecemeal using a cold snare. Resected and                            retrieved.                           - Three 4 to 6 mm polyps in the descending colon                            and in the transverse colon, removed with a cold                            snare. Resected and retrieved.                           - Two 4 to 6 mm polyps in the sigmoid colon and in                            the descending colon, removed with a cold snare.                            Resected and retrieved.                           - The examination was otherwise normal on direct                            and retroflexion views. Recommendation:           - Patient has a contact number available for                            emergencies. The signs and symptoms of potential                            delayed complications were discussed with the                            patient. Return to normal activities tomorrow.                            Written discharge instructions were provided to the                            patient.                           -  Resume previous diet.                           - Continue present medications.                           - Await pathology results.                           - Repeat  colonoscopy is recommended for                            surveillance. The colonoscopy date will be                            determined after pathology results from today's                            exam become available for review. Jolleen Seman L. Loletha Carrow, MD 10/02/2020 11:23:34 AM This report has been signed electronically.

## 2020-10-02 NOTE — Progress Notes (Signed)
Called to room to assist during endoscopic procedure.  Patient ID and intended procedure confirmed with present staff. Received instructions for my participation in the procedure from the performing physician.  

## 2020-10-02 NOTE — Progress Notes (Signed)
Report given to PACU, vss 

## 2020-10-02 NOTE — Patient Instructions (Signed)
Resume previous diet and medications. Await pathology results. Repeat colonoscopy, date will be determined based on pathology results.  YOU HAD AN ENDOSCOPIC PROCEDURE TODAY AT [facility]:   Refer to the procedure report that was given to you for any specific questions about what was found during the examination.  If the procedure report does not answer your questions, please call your gastroenterologist to clarify.  If you requested that your care partner not be given the details of your procedure findings, then the procedure report has been included in a sealed envelope for you to review at your convenience later.  YOU SHOULD EXPECT: Some feelings of bloating in the abdomen. Passage of more gas than usual.  Walking can help get rid of the air that was put into your GI tract during the procedure and reduce the bloating. If you had a lower endoscopy (such as a colonoscopy or flexible sigmoidoscopy) you may notice spotting of blood in your stool or on the toilet paper. If you underwent a bowel prep for your procedure, you may not have a normal bowel movement for a few days.  Please Note:  You might notice some irritation and congestion in your nose or some drainage.  This is from the oxygen used during your procedure.  There is no need for concern and it should clear up in a day or so.  SYMPTOMS TO REPORT IMMEDIATELY:   Following lower endoscopy (colonoscopy or flexible sigmoidoscopy):  Excessive amounts of blood in the stool  Significant tenderness or worsening of abdominal pains  Swelling of the abdomen that is new, acute  Fever of 100F or higher  For urgent or emergent issues, a gastroenterologist can be reached at any hour by calling [phone]. Do not use MyChart messaging for urgent concerns.    DIET:  We do recommend a small meal at first, but then you may proceed to your regular diet.  Drink plenty of fluids but you should avoid alcoholic beverages for 24  hours.  ACTIVITY:  You should plan to take it easy for the rest of today and you should NOT DRIVE or use heavy machinery until tomorrow (because of the sedation medicines used during the test).    FOLLOW UP: Our staff will call the number listed on your records 48-72 hours following your procedure to check on you and address any questions or concerns that you may have regarding the information given to you following your procedure. If we do not reach you, we will leave a message.  We will attempt to reach you two times.  During this call, we will ask if you have developed any symptoms of COVID 19. If you develop any symptoms (ie: fever, flu-like symptoms, shortness of breath, cough etc.) before then, please call [phone].  If you test positive for Covid 19 in the 2 weeks post procedure, please call and report this information to [place].    If any biopsies were taken you will be contacted by phone or by letter within the next 1-3 weeks.  Please call us at [phone] if you have not heard about the biopsies in 3 weeks.    SIGNATURES/CONFIDENTIALITY: You and/or your care partner have signed paperwork which will be entered into your electronic medical record.  These signatures attest to the fact that that the information above on your After Visit Summary has been reviewed and is understood.  Full responsibility of the confidentiality of this discharge information lies with you and/or your care-partner.

## 2020-10-06 ENCOUNTER — Telehealth: Payer: Self-pay | Admitting: *Deleted

## 2020-10-06 NOTE — Telephone Encounter (Signed)
  Follow up Call-  Call back number 10/02/2020  Post procedure Call Back phone  # [phone]  Permission to leave phone message Yes  Some recent data might be hidden     Patient questions:  Do you have a fever, pain , or abdominal swelling? No. Pain Score  0 *  Have you tolerated food without any problems? Yes.    Have you been able to return to your normal activities? Yes.    Do you have any questions about your discharge instructions: Diet   No. Medications  No. Follow up visit  No.  Do you have questions or concerns about your Care? No.  Actions: * If pain score is 4 or above: No action needed, pain <4.   1. Have you developed a fever since your procedure? no  2.   Have you had an respiratory symptoms (SOB or cough) since your procedure? no  3.   Have you tested positive for COVID 19 since your procedure no  4.   Have you had any family members/close contacts diagnosed with the COVID 19 since your procedure?  no   If yes to any of these questions please route to Joylene John, RN and Joella Prince, RN

## 2020-10-10 ENCOUNTER — Encounter: Payer: Self-pay | Admitting: Gastroenterology

## 2021-03-23 ENCOUNTER — Telehealth: Payer: Self-pay

## 2021-03-23 ENCOUNTER — Other Ambulatory Visit: Payer: Self-pay

## 2021-03-23 ENCOUNTER — Other Ambulatory Visit: Payer: BC Managed Care – PPO

## 2021-03-23 DIAGNOSIS — Z111 Encounter for screening for respiratory tuberculosis: Secondary | ICD-10-CM

## 2021-03-23 NOTE — Telephone Encounter (Signed)
Noted  

## 2021-03-23 NOTE — Telephone Encounter (Signed)
.  Type of forms received:Physical examination  Routed [id number] [employer]   Paperwork received by : Jinny Blossom    Individual made aware of 3-5 business day turn around (Y/N):YES  Form completed and patient made aware of charges(Y/N):   Faxed to : CALL PATIENT   Form location:  Placed in [place] in box

## 2021-03-27 LAB — QUANTIFERON-TB GOLD PLUS
Mitogen-NIL: >10 IU/mL
NIL: 0.02 IU/mL
QuantiFERON-TB Gold Plus: NEGATIVE
TB1-NIL: 0.2 IU/mL
TB2-NIL: 0.16 IU/mL

## 2021-04-10 ENCOUNTER — Telehealth: Payer: Self-pay

## 2021-04-10 NOTE — Telephone Encounter (Signed)
Pt came into the office asking about vaccines. 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 He needs MMR and Varicella. He has already taken 1 of each and he stated that he now needs the second dosages. Can we schedule for a nurse visit. Please Advise.

## 2021-04-10 NOTE — Telephone Encounter (Signed)
Pt is scheduled °

## 2021-04-10 NOTE — Telephone Encounter (Signed)
Yes, that's fine 

## 2021-04-15 ENCOUNTER — Ambulatory Visit (INDEPENDENT_AMBULATORY_CARE_PROVIDER_SITE_OTHER): Payer: BC Managed Care – PPO

## 2021-04-15 ENCOUNTER — Other Ambulatory Visit: Payer: Self-pay

## 2021-04-15 DIAGNOSIS — Z23 Encounter for immunization: Secondary | ICD-10-CM

## 2021-06-15 NOTE — Progress Notes (Signed)
Phone: [phone]    Subjective:  Patient presents today for their annual physical. Chief complaint-noted.   See problem oriented charting- ROS- full  review of systems was completed and negative  except for: fatigue, joint pain, back pain, muscle aches  The following were reviewed and entered/updated in epic: Past Medical History:  Diagnosis Date   Anemia 04/30/2014   Hyperlipidemia    Inguinal hernia    Rotator cuff injury    Syncope    after hernia surgery   Umbilical hernia    Patient Active Problem List   Diagnosis Date Noted   Fatty liver 04/30/2014    Priority: Medium    Anemia 04/30/2014    Priority: Medium    Hyperlipidemia, unspecified 04/30/2010    Priority: Medium    Right inguinal hernia 11/29/2011    Priority: Low   ROTATOR CUFF SYNDROME, RIGHT 04/30/2010    Priority: Low   Vitamin D deficiency 06/22/2021   Past Surgical History:  Procedure Laterality Date   APPENDECTOMY  12/1993   CHOLECYSTECTOMY  12/16/2011   Procedure: LAPAROSCOPIC CHOLECYSTECTOMY WITH INTRAOPERATIVE CHOLANGIOGRAM;  Surgeon: Adin Hector, MD;  Location: [facility];  Service: General;  Laterality: N/A;   HERNIA REPAIR  12/06/2011   Right Inguinal   REFRACTIVE SURGERY Bilateral    VASECTOMY  07/2014    Family History  Problem Relation Age of Onset   Hypertension Father    Hyperlipidemia Brother        mother   Heart disease Brother        congenital died at 21-sudden death   Colon cancer Neg Hx    Colon polyps Neg Hx    Esophageal cancer Neg Hx    Stomach cancer Neg Hx    Rectal cancer Neg Hx     Medications- reviewed and updated Current Outpatient Medications  Medication Sig Dispense Refill   ibuprofen (ADVIL) 100 MG/5ML suspension Take 200 mg by mouth every 4 (four) hours as needed.     No current facility-administered medications for this visit.    Allergies-reviewed and updated No Known Allergies  Social History   Social History Narrative   Married  01/2014. 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       Hobbies: workout (aerobics and weights)      Objective:  BP 120/70   Pulse 68   Temp 97.6 F (36.4 C)   Ht '6\' 1"'  (1.854 m)   Wt 269 lb 3.2 oz (122.1 kg)   SpO2 97%   BMI 35.52 kg/m  Gen: NAD, resting comfortably HEENT: Mucous membranes are moist. Oropharynx normal Neck: no thyromegaly CV: RRR no murmurs rubs or gallops Lungs: CTAB no crackles, wheeze, rhonchi Abdomen: soft/nontender/nondistended/normal bowel sounds. No rebound or guarding.  Ext: no edema Skin: warm, dry Neuro: grossly normal, moves all extremities, PERRLA     Assessment and Plan:  45 y.o. male presenting for annual physical.  Health Maintenance counseling: 1. Anticipatory guidance: Patient counseled regarding regular dental exams -q6 months - working on getting new dentist still, eye exams - yearly,  avoiding smoking and second hand smoke , limiting alcohol to 2 beverages per day- 2 or less per year, no illicit drugs.   2. Risk factor reduction:  Advised patient of need for regular exercise and diet rich and fruits and vegetables to reduce risk of heart attack and stroke.Marland Kitchen Exercise-  5-6 days a week at the gym .  Diet/weight management-Does have a sweet tooth. Calorie counting doesn't work well for him. has been as low as 199 in past and on cone scales 223 in 2013. Feels like eating at work is his big problem- tough to pack food- may try some fruits. Lofty goals of food prep Wt Readings from Last 3 Encounters:  06/22/21 269 lb 3.2 oz (122.1 kg)  10/02/20 256 lb (116.1 kg)  09/18/20 265 lb (120.2 kg)  3. Immunizations/screenings/ancillary studies DISCUSSED:  -COVID booster vaccination #3 - discussed at pharmacy- he opts out Immunization History  Administered Date(s) Administered   Hepatitis B 09/30/2005, 07/02/2006, 04/08/2007   Influenza Whole 08/03/2011    Influenza,inj,Quad PF,6+ Mos 04/30/2014, 06/23/2015, 06/15/2017, 06/26/2018, 06/20/2020, 04/15/2021   MMR 03/07/2020, 04/15/2021   PFIZER(Purple Top)SARS-COV-2 Vaccination 11/14/2019, 12/03/2019   PPD Test 03/05/2020   Td 03/01/2006   Tdap 06/15/2017   Varicella 03/07/2020, 04/15/2021  4. Prostate cancer screening-  no family history, start at age 50  5. Colon cancer screening - colonoscopy 10/02/20 with 1 year repeat planned. #History adenomatous polyp October 02, 2020 with 1 year repeat with Dr. Loletha Carrow 6. Skin cancer screening/prevention- no dermatologist . advised regular sunscreen use. Denies worrisome, changing, or new skin lesions.  7. Testicular cancer screening- advised monthly self exams  8. STD screening- patient opts monogamous  9. Smoking associated screening- Former smoker- quit in 2008 with 11 pack years- will get UA with labs when comes back  Status of chronic or acute concerns   # a lot of work stress- has had some weight gain  # fatigue/snoring/excessive daytimes sleepiness S:prior vitamin D repletion did not resolve fatigue.  He has now had ongoing issues since he had COVID in August 2020.  He is extremely busy at work but he feels like this is in addition to that level of fatigue.  He can fall asleep sometimes just sitting there at 2:00 in the afternoon-complains of excessive daytime sleepiness.  No chest pain or shortness of breath still.  His wife has reported he has started to snore in the last few years-he does not feel like this is excessive.  Notes decreased libido and less morning erections. Can still get erection if in the mood.  A/P: 45 year old male with ongoing fatigue despite repletion of vitamin D in the past-vitamin D can still be an issue but I do not think it is a primary cause.  We will update labs including testosterone testing-he will come back fasting between 8 and 9 AM  -OSA testing also recommended with snoring and excessive daytime sleepinesnss  #  Fatigue- patient with ongoing fatigue since covid 21 March 2019. Also wondered if age, stressors, work schedule contributing can have calmed easy day at work and be exhausted by evening time and fall asleep on the couch. Wife thought he was working more early mornings leaving house at 3 AM for 3-4 days a week. Used to be able to knock out days like that without issues. No chest pain or shortness of breath. Wife reported he had started snoring in last few years. Denied brain fog but wife stated he repeated himself a few times.  -if nothing was found in blood work, consider sleep apnea testing -this could be covid 19 long haul syndrome- he did not want to try medication at this time which was reaosnable   #hyperlipidemia S: Medication: none  Lab Results  Component Value Date   CHOL 235 (H) 06/24/2020  HDL 40 06/24/2020   LDLCALC 145 (H) 06/24/2020   LDLDIRECT 136.0 06/26/2018   TRIG 324 (H) 06/24/2020   CHOLHDL 5.9 (H) 06/24/2020   A/P: 10-year ASCVD risk of 3.4%-needs work on healthy eating/continue healthy exercise/weight loss  #Vitamin D deficiency S: Medication: took the boost for 12 weeks but not taking ongoing therapy Last vitamin D Lab Results  Component Value Date   VD25OH 46 (L) 06/24/2020  A/P: recommended minimum 1000 units a day- but likely will need another boost.    Son with influenza- opts out of tamiflu prophylaxis  Recommended follow up: Return in about 1 year (around 06/22/2022) for physical or sooner if needed.  Lab/Order associations:NOT fasting- will come back fasting   ICD-10-CM   1. Preventative health care  Z00.00 CBC with Differential/Platelet    Comprehensive metabolic panel    Lipid panel    VITAMIN D 25 Hydroxy (Vit-D Deficiency, Fractures)    TSH    Testosterone Total,Free,Bio, Males-([facility])    Ambulatory referral to Pulmonology    2. Fatigue, unspecified type  R53.83 TSH    Testosterone Total,Free,Bio, Males-([facility])    3. Hyperlipidemia,  unspecified hyperlipidemia type  E78.5 CBC with Differential/Platelet    Comprehensive metabolic panel    Lipid panel    TSH    4. Former smoker  Z87.891     5. Vitamin D deficiency  E55.9 VITAMIN D 25 Hydroxy (Vit-D Deficiency, Fractures)    6. Excessive daytime sleepiness  G47.19 Ambulatory referral to Pulmonology    7. Snoring  R06.83 Ambulatory referral to Pulmonology    8. Low libido  R68.82 Testosterone Total,Free,Bio, Males-([facility])      No orders of the defined types were placed in this encounter.   I,Jada Bradford,acting as a scribe for Garret Reddish, MD.,have documented all relevant documentation on the behalf of Garret Reddish, MD,as directed by  Garret Reddish, MD while in the presence of Garret Reddish, MD.  I, Garret Reddish, MD, have reviewed all documentation for this visit. The documentation on 06/22/21 for the exam, diagnosis, procedures, and orders are all accurate and complete.  Return precautions advised.   Garret Reddish, MD

## 2021-06-22 ENCOUNTER — Ambulatory Visit (INDEPENDENT_AMBULATORY_CARE_PROVIDER_SITE_OTHER): Payer: BC Managed Care – PPO | Admitting: Family Medicine

## 2021-06-22 ENCOUNTER — Other Ambulatory Visit: Payer: Self-pay

## 2021-06-22 ENCOUNTER — Encounter: Payer: Self-pay | Admitting: Family Medicine

## 2021-06-22 VITALS — BP 120/70 | HR 68 | Temp 97.6°F | Ht 73.0 in | Wt 269.2 lb

## 2021-06-22 DIAGNOSIS — Z0000 Encounter for general adult medical examination without abnormal findings: Secondary | ICD-10-CM | POA: Diagnosis not present

## 2021-06-22 DIAGNOSIS — E559 Vitamin D deficiency, unspecified: Secondary | ICD-10-CM

## 2021-06-22 DIAGNOSIS — E785 Hyperlipidemia, unspecified: Secondary | ICD-10-CM | POA: Diagnosis not present

## 2021-06-22 DIAGNOSIS — Z87891 Personal history of nicotine dependence: Secondary | ICD-10-CM | POA: Diagnosis not present

## 2021-06-22 DIAGNOSIS — R5383 Other fatigue: Secondary | ICD-10-CM | POA: Diagnosis not present

## 2021-06-22 DIAGNOSIS — R0683 Snoring: Secondary | ICD-10-CM

## 2021-06-22 DIAGNOSIS — R6882 Decreased libido: Secondary | ICD-10-CM

## 2021-06-22 DIAGNOSIS — G4719 Other hypersomnia: Secondary | ICD-10-CM

## 2021-06-22 NOTE — Patient Instructions (Addendum)
Schedule with dentist ASAP!   Schedule a lab visit at the check out desk within 2 weeks. Return for future fasting labs meaning nothing but water after midnight please. Ok to take your medications with water. MUST BE 8-9 AM fasting  Let us know if you start getting flu like symptoms  Lets work on getting weight back down to 225 over next 2-3 years.    Recommended follow up: Return in about 1 year (around 06/22/2022) for physical or sooner if needed.

## 2021-07-03 ENCOUNTER — Other Ambulatory Visit (INDEPENDENT_AMBULATORY_CARE_PROVIDER_SITE_OTHER): Payer: BC Managed Care – PPO

## 2021-07-03 ENCOUNTER — Other Ambulatory Visit: Payer: Self-pay

## 2021-07-03 ENCOUNTER — Other Ambulatory Visit: Payer: Self-pay | Admitting: Family Medicine

## 2021-07-03 DIAGNOSIS — Z0000 Encounter for general adult medical examination without abnormal findings: Secondary | ICD-10-CM | POA: Diagnosis not present

## 2021-07-03 DIAGNOSIS — E559 Vitamin D deficiency, unspecified: Secondary | ICD-10-CM

## 2021-07-03 DIAGNOSIS — E785 Hyperlipidemia, unspecified: Secondary | ICD-10-CM

## 2021-07-03 DIAGNOSIS — R5383 Other fatigue: Secondary | ICD-10-CM | POA: Diagnosis not present

## 2021-07-03 DIAGNOSIS — R6882 Decreased libido: Secondary | ICD-10-CM

## 2021-07-03 DIAGNOSIS — Z87891 Personal history of nicotine dependence: Secondary | ICD-10-CM | POA: Diagnosis not present

## 2021-07-03 LAB — POC URINALSYSI DIPSTICK (AUTOMATED)
Bilirubin, UA: NEGATIVE
Blood, UA: NEGATIVE
Glucose, UA: NEGATIVE
Ketones, UA: NEGATIVE
Leukocytes, UA: NEGATIVE
Nitrite, UA: NEGATIVE
Protein, UA: NEGATIVE
Spec Grav, UA: 1.025 (ref 1.010–1.025)
Urobilinogen, UA: 0.2 E.U./dL
pH, UA: 6 (ref 5.0–8.0)

## 2021-07-03 LAB — CBC WITH DIFFERENTIAL/PLATELET
Basophils Absolute: 0 10*3/uL (ref 0.0–0.1)
Basophils Relative: 0.5 % (ref 0.0–3.0)
Eosinophils Absolute: 0.3 10*3/uL (ref 0.0–0.7)
Eosinophils Relative: 2.8 % (ref 0.0–5.0)
HCT: 39.1 % (ref 39.0–52.0)
Hemoglobin: 13.5 g/dL (ref 13.0–17.0)
Lymphocytes Relative: 18.6 % (ref 12.0–46.0)
Lymphs Abs: 1.8 10*3/uL (ref 0.7–4.0)
MCHC: 34.6 g/dL (ref 30.0–36.0)
MCV: 84.6 fl (ref 78.0–100.0)
Monocytes Absolute: 0.7 10*3/uL (ref 0.1–1.0)
Monocytes Relative: 7 % (ref 3.0–12.0)
Neutro Abs: 6.9 10*3/uL (ref 1.4–7.7)
Neutrophils Relative %: 71.1 % (ref 43.0–77.0)
Platelets: 240 10*3/uL (ref 150.0–400.0)
RBC: 4.62 Mil/uL (ref 4.22–5.81)
RDW: 13 % (ref 11.5–15.5)
WBC: 9.6 10*3/uL (ref 4.0–10.5)

## 2021-07-03 LAB — LIPID PANEL
Cholesterol: 228 mg/dL — ABNORMAL HIGH (ref 0–200)
HDL: 38.1 mg/dL — ABNORMAL LOW (ref >39.00)
NonHDL: 189.67
Total CHOL/HDL Ratio: 6
Triglycerides: 286 mg/dL — ABNORMAL HIGH (ref 0.0–149.0)
VLDL: 57.2 mg/dL — ABNORMAL HIGH (ref 0.0–40.0)

## 2021-07-03 LAB — COMPREHENSIVE METABOLIC PANEL
ALT: 30 U/L (ref 0–53)
AST: 27 U/L (ref 0–37)
Albumin: 4.6 g/dL (ref 3.5–5.2)
Alkaline Phosphatase: 77 U/L (ref 39–117)
BUN: 19 mg/dL (ref 6–23)
CO2: 30 mEq/L (ref 19–32)
Calcium: 9.6 mg/dL (ref 8.4–10.5)
Chloride: 102 mEq/L (ref 96–112)
Creatinine, Ser: 1.12 mg/dL (ref 0.40–1.50)
GFR: 78.81 mL/min (ref >60.00)
Glucose, Bld: 93 mg/dL (ref 70–99)
Potassium: 4.2 mEq/L (ref 3.5–5.1)
Sodium: 140 mEq/L (ref 135–145)
Total Bilirubin: 0.6 mg/dL (ref 0.2–1.2)
Total Protein: 7.6 g/dL (ref 6.0–8.3)

## 2021-07-03 LAB — LDL CHOLESTEROL, DIRECT: Direct LDL: 142 mg/dL

## 2021-07-03 LAB — TSH: TSH: 3.78 u[IU]/mL (ref 0.35–5.50)

## 2021-07-03 LAB — VITAMIN D 25 HYDROXY (VIT D DEFICIENCY, FRACTURES): VITD: 17.47 ng/mL — ABNORMAL LOW (ref 30.00–100.00)

## 2021-07-03 MED ORDER — VITAMIN D (ERGOCALCIFEROL) 1.25 MG (50000 UNIT) PO CAPS: 50000.0000 [IU] | ORAL_CAPSULE | ORAL | 1 refills | Status: DC

## 2021-07-04 LAB — TESTOSTERONE TOTAL,FREE,BIO, MALES
Albumin: 4.3 g/dL (ref 3.6–5.1)
Sex Hormone Binding: 24 nmol/L (ref 10–50)
Testosterone, Bioavailable: 112.7 ng/dL (ref 110.0–575.0)
Testosterone, Free: 57.2 pg/mL (ref 46.0–224.0)
Testosterone: 342 ng/dL (ref 250–827)

## 2021-08-14 ENCOUNTER — Encounter: Payer: Self-pay | Admitting: Pulmonary Disease

## 2021-08-14 ENCOUNTER — Other Ambulatory Visit: Payer: Self-pay

## 2021-08-14 ENCOUNTER — Ambulatory Visit (INDEPENDENT_AMBULATORY_CARE_PROVIDER_SITE_OTHER): Payer: BC Managed Care – PPO | Admitting: Pulmonary Disease

## 2021-08-14 VITALS — BP 120/72 | HR 71 | Ht 73.0 in | Wt 269.0 lb

## 2021-08-14 DIAGNOSIS — R400 Somnolence: Secondary | ICD-10-CM

## 2021-08-14 NOTE — Patient Instructions (Signed)
We will schedule you for home sleep study  Update your results as soon as reviewed all  Blood work for thyroid functions TSH, T4  We will see you back in about 3 to 4 months  Call with significant concerns  Sleep Apnea Sleep apnea affects breathing during sleep. It causes breathing to stop for 10 seconds or more, or to become shallow. People with sleep apnea usually snore loudly. It can also increase the risk of: Heart attack. Stroke. Being very overweight (obese). Diabetes. Heart failure. Irregular heartbeat. High blood pressure. The goal of treatment is to help you breathe normally again. What are the causes? The most common cause of this condition is a collapsed or blocked airway. There are three kinds of sleep apnea: Obstructive sleep apnea. This is caused by a blocked or collapsed airway. Central sleep apnea. This happens when the brain does not send the right signals to the muscles that control breathing. Mixed sleep apnea. This is a combination of obstructive and central sleep apnea. What increases the risk? Being overweight. Smoking. Having a small airway. Being older. Being male. Drinking alcohol. Taking medicines to calm yourself (sedatives or tranquilizers). Having family members with the condition. Having a tongue or tonsils that are larger than normal. What are the signs or symptoms? Trouble staying asleep. Loud snoring. Headaches in the morning. Waking up gasping. Dry mouth or sore throat in the morning. Being sleepy or tired during the day. If you are sleepy or tired during the day, you may also: Not be able to focus your mind (concentrate). Forget things. Get angry a lot and have mood swings. Feel sad (depressed). Have changes in your personality. Have less interest in sex, if you are male. Be unable to have an erection, if you are male. How is this treated?  Sleeping on your side. Using a medicine to get rid of mucus in your nose  (decongestant). Avoiding the use of alcohol, medicines to help you relax, or certain pain medicines (narcotics). Losing weight, if needed. Changing your diet. Quitting smoking. Using a machine to open your airway while you sleep, such as: An oral appliance. This is a mouthpiece that shifts your lower jaw forward. A CPAP device. This device blows air through a mask when you breathe out (exhale). An EPAP device. This has valves that you put in each nostril. A BIPAP device. This device blows air through a mask when you breathe in (inhale) and breathe out. Having surgery if other treatments do not work. Follow these instructions at home: Lifestyle Make changes that your doctor recommends. Eat a healthy diet. Lose weight if needed. Avoid alcohol, medicines to help you relax, and some pain medicines. Do not smoke or use any products that contain nicotine or tobacco. If you need help quitting, ask your doctor. General instructions Take over-the-counter and prescription medicines only as told by your doctor. If you were given a machine to use while you sleep, use it only as told by your doctor. If you are having surgery, make sure to tell your doctor you have sleep apnea. You may need to bring your device with you. Keep all follow-up visits. Contact a doctor if: The machine that you were given to use during sleep bothers you or does not seem to be working. You do not get better. You get worse. Get help right away if: Your chest hurts. You have trouble breathing in enough air. You have an uncomfortable feeling in your back, arms, or stomach. You have trouble  talking. One side of your body feels weak. A part of your face is hanging down. These symptoms may be an emergency. Get help right away. Call your local emergency services (911 in the [place] Do not wait to see if the symptoms will go away. Do not drive yourself to the hospital. Summary This condition affects breathing during  sleep. The most common cause is a collapsed or blocked airway. The goal of treatment is to help you breathe normally while you sleep. This information is not intended to replace advice given to you by your health care provider. Make sure you discuss any questions you have with your health care provider. Document Revised: 02/25/2021 Document Reviewed: 06/27/2020 Elsevier Patient Education  2022 [employer].

## 2021-08-14 NOTE — Progress Notes (Signed)
Thomas Gilmore    [id number]    [birth year]  Primary Care Physician:Hunter, Brayton Mars, MD  Referring Physician: Marin Olp, [place] [place],  [place] [address]  Chief complaint:    Patient with excessive daytime sleepiness  HPI:  Patient with excessive daytime sleepiness No witnessed apneas Significant snoring  Bedtime is between 9 and 11, falls asleep easily 3-4 awakenings Wake up time is variable Weight has been relatively stable  Does have choking episodes at sleep onset  No reports of apneas  Very sleepy during the day whenever he has any downtime can fall asleep in 5 minutes and usually sleeps about 20 to 30 minutes, the naps are not restorative  Had COVID in 2020 and he remembers that was when his daytime sleepiness got worse  Outpatient Encounter Medications as of 08/14/2021  Medication Sig   Vitamin D, Ergocalciferol, (DRISDOL) 1.25 MG (50000 UNIT) CAPS capsule Take 1 capsule (50,000 Units total) by mouth every 7 (seven) days.   [DISCONTINUED] ibuprofen (ADVIL) 100 MG/5ML suspension Take 200 mg by mouth every 4 (four) hours as needed.   No facility-administered encounter medications on file as of 08/14/2021.    Allergies as of 08/14/2021   (No Known Allergies)    Past Medical History:  Diagnosis Date   Anemia 04/30/2014   Hyperlipidemia    Inguinal hernia    Rotator cuff injury    Syncope    after hernia surgery   Umbilical hernia     Past Surgical History:  Procedure Laterality Date   APPENDECTOMY  12/1993   CHOLECYSTECTOMY  12/16/2011   Procedure: LAPAROSCOPIC CHOLECYSTECTOMY WITH INTRAOPERATIVE CHOLANGIOGRAM;  Surgeon: Adin Hector, MD;  Location: [facility];  Service: General;  Laterality: N/A;   HERNIA REPAIR  12/06/2011   Right Inguinal   REFRACTIVE SURGERY Bilateral    VASECTOMY  07/2014    Family History  Problem Relation Age of Onset   Hypertension Father    Hyperlipidemia Brother        mother   Heart  disease Brother        congenital died at 21-sudden death   Colon cancer Neg Hx    Colon polyps Neg Hx    Esophageal cancer Neg Hx    Stomach cancer Neg Hx    Rectal cancer Neg Hx     Social History   Socioeconomic History   Marital status: Single    Spouse name: Not on file   Number of children: Not on file   Years of education: Not on file   Highest education level: Not on file  Occupational History   Not on file  Tobacco Use   Smoking status: Former    Packs/day: 1.00    Years: 11.00    Pack years: 11.00    Types: Cigarettes    Quit date: 06/03/2007    Years since quitting: 14.2   Smokeless tobacco: Never  Vaping Use   Vaping Use: Never used  Substance and Sexual Activity   Alcohol use: Not Currently   Drug use: No    Types: Marijuana    Comment: many years ago   Sexual activity: Never  Other Topics Concern   Not on file  Social History Narrative   Married 01/2014. 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  Hobbies: workout (aerobics and weights)   Social Determinants of Health   Financial Resource Strain: Not on file  Food Insecurity: Not on file  Transportation Needs: Not on file  Physical Activity: Not on file  Stress: Not on file  Social Connections: Not on file  Intimate Partner Violence: Not on file    Review of Systems  Constitutional:  Positive for fatigue.  Psychiatric/Behavioral:  Positive for sleep disturbance.    Vitals:   08/14/21 1559  BP: 120/72  Pulse: 71  SpO2: 98%     Physical Exam Constitutional:      Appearance: He is obese.  HENT:     Head: Normocephalic.     Nose: Nose normal.     Mouth/Throat:     Mouth: Mucous membranes are moist.     Comments: Mallampati 3, crowded oropharynx Eyes:     Pupils: Pupils are equal, round, and reactive to light.  Cardiovascular:     Rate and Rhythm: Normal rate and regular rhythm.      Heart sounds: No murmur heard.   No friction rub.  Pulmonary:     Effort: No respiratory distress.     Breath sounds: No stridor. No wheezing or rhonchi.  Musculoskeletal:     Cervical back: No rigidity or tenderness.  Neurological:     Mental Status: He is alert.   Results of the Epworth flowsheet 08/14/2021  Sitting and reading 3  Watching TV 3  Sitting, inactive in a public place (e.g. a theatre or a meeting) 3  As a passenger in a car for an hour without a break 3  Lying down to rest in the afternoon when circumstances permit 3  Sitting and talking to someone 3  Sitting quietly after a lunch without alcohol 2  In a car, while stopped for a few minutes in traffic 0  Total score 20     Data Reviewed: No previous sleep study  Assessment:  Excessive daytime sleepiness  Moderate probability of significant obstructive sleep apnea  Pathophysiology of sleep disordered breathing discussed with the patient Treatment options for sleep disordered breathing discussed with the patient  Plan/Recommendations: Will schedule patient for home sleep study  Weight loss efforts encouraged  Tentative follow-up in 3 to 4 months  Encouraged to call with any significant concerns  Order TFT for his daytime fatigue  Sherrilyn Rist MD [facility] 08/14/2021, 4:05 PM  CC: Marin Olp, MD

## 2021-08-15 LAB — THYROID PANEL WITH TSH
Free Thyroxine Index: 1.5 (ref 1.4–3.8)
T3 Uptake: 27 % (ref 22–35)
T4, Total: 5.7 ug/dL (ref 4.9–10.5)
TSH: 3.44 mIU/L (ref 0.40–4.50)

## 2021-08-17 NOTE — Progress Notes (Signed)
Thyroid function test within normal limits

## 2021-10-19 ENCOUNTER — Encounter: Payer: Self-pay | Admitting: Gastroenterology

## 2022-01-07 ENCOUNTER — Ambulatory Visit: Payer: BC Managed Care – PPO | Admitting: Family Medicine

## 2022-03-09 ENCOUNTER — Encounter: Payer: Self-pay | Admitting: Pulmonary Disease

## 2022-03-09 NOTE — Telephone Encounter (Signed)
Please advise 

## 2022-03-11 ENCOUNTER — Ambulatory Visit: Payer: BC Managed Care – PPO

## 2022-03-11 DIAGNOSIS — G4710 Hypersomnia, unspecified: Secondary | ICD-10-CM | POA: Diagnosis not present

## 2022-03-20 ENCOUNTER — Telehealth: Payer: Self-pay | Admitting: Pulmonary Disease

## 2022-03-20 DIAGNOSIS — G4710 Hypersomnia, unspecified: Secondary | ICD-10-CM | POA: Diagnosis not present

## 2022-03-20 NOTE — Telephone Encounter (Signed)
Call patient  Sleep study result  Date of study: 03/11/2022  Impression: Negative for significant sleep disordered breathing No significant oxygen desaturations  Recommendation: Home sleep studies can be falsely negative  If there is ongoing concerns with significant obstructive sleep apnea, I will recommend that we order an in lab polysomnogram to definitely ascertain absence of significant sleep disordered breathing  Sleep position optimization by encouraging sleep in a lateral position, elevating the head of the bed by about 30 degrees may help noted snoring  Encourage regular exercise and weight loss efforts  Caution against driving when sleepy & against medications with sedative side effects.

## 2022-03-22 NOTE — Addendum Note (Signed)
Addended by: Dessie Coma on: 03/22/2022 02:21 PM   Modules accepted: Orders

## 2022-03-22 NOTE — Telephone Encounter (Signed)
I called the patient and gave him the results and he voices understanding. Nothing further needed.

## 2022-04-26 ENCOUNTER — Encounter: Payer: Self-pay | Admitting: *Deleted

## 2022-06-21 ENCOUNTER — Ambulatory Visit (INDEPENDENT_AMBULATORY_CARE_PROVIDER_SITE_OTHER): Payer: BC Managed Care – PPO | Admitting: Family Medicine

## 2022-06-21 ENCOUNTER — Encounter: Payer: Self-pay | Admitting: Family Medicine

## 2022-06-21 VITALS — BP 128/74 | HR 68 | Temp 98.0°F | Ht 73.0 in | Wt 275.0 lb

## 2022-06-21 DIAGNOSIS — E559 Vitamin D deficiency, unspecified: Secondary | ICD-10-CM | POA: Diagnosis not present

## 2022-06-21 DIAGNOSIS — Z0000 Encounter for general adult medical examination without abnormal findings: Secondary | ICD-10-CM | POA: Diagnosis not present

## 2022-06-21 DIAGNOSIS — Z79899 Other long term (current) drug therapy: Secondary | ICD-10-CM

## 2022-06-21 DIAGNOSIS — E785 Hyperlipidemia, unspecified: Secondary | ICD-10-CM | POA: Diagnosis not present

## 2022-06-21 DIAGNOSIS — Z87891 Personal history of nicotine dependence: Secondary | ICD-10-CM

## 2022-06-21 DIAGNOSIS — Z23 Encounter for immunization: Secondary | ICD-10-CM | POA: Diagnosis not present

## 2022-06-21 NOTE — Patient Instructions (Addendum)
[facility] contact Please call to schedule visit and/or procedure Address: [address], [place], [place] [address] Phone: [phone]   Schedule a lab visit at the check out desk within 2 weeks. Return for future fasting labs meaning nothing but water after midnight please. Ok to take your medications with water.   Recommended follow up: Return in about 1 year (around 06/22/2023) for physical or sooner if needed.Schedule b4 you leave.

## 2022-06-21 NOTE — Progress Notes (Signed)
Phone: [phone]    Subjective:  Patient presents today for their annual physical. Chief complaint-noted.   See problem oriented charting- ROS- full  review of systems was completed and negative  except for: ongoing fatigue  The following were reviewed and entered/updated in epic: Past Medical History:  Diagnosis Date   Anemia 04/30/2014   Hyperlipidemia    Inguinal hernia    Rotator cuff injury    Syncope    after hernia surgery   Umbilical hernia    Patient Active Problem List   Diagnosis Date Noted   Fatty liver 04/30/2014    Priority: Medium    Anemia 04/30/2014    Priority: Medium    Hyperlipidemia, unspecified 04/30/2010    Priority: Medium    Right inguinal hernia 11/29/2011    Priority: Low   ROTATOR CUFF SYNDROME, RIGHT 04/30/2010    Priority: Low   Vitamin D deficiency 06/22/2021   Past Surgical History:  Procedure Laterality Date   APPENDECTOMY  12/1993   CHOLECYSTECTOMY  12/16/2011   Procedure: LAPAROSCOPIC CHOLECYSTECTOMY WITH INTRAOPERATIVE CHOLANGIOGRAM;  Surgeon: Adin Hector, MD;  Location: [facility];  Service: General;  Laterality: N/A;   HERNIA REPAIR  12/06/2011   Right Inguinal   REFRACTIVE SURGERY Bilateral    VASECTOMY  07/2014    Family History  Problem Relation Age of Onset   Hypertension Father    Hyperlipidemia Brother        mother   Heart disease Brother        congenital died at 21-sudden death   Colon cancer Neg Hx    Colon polyps Neg Hx    Esophageal cancer Neg Hx    Stomach cancer Neg Hx    Rectal cancer Neg Hx     Medications- reviewed and updated Current Outpatient Medications  Medication Sig Dispense Refill   anastrozole (ARIMIDEX) 1 MG tablet Take 1 mg by mouth once a week.     testosterone cypionate (DEPOTESTOSTERONE CYPIONATE) 200 MG/ML injection Inject 200 mg into the muscle once a week.     NP THYROID 60 MG tablet Take 60 mg by mouth daily.     No current facility-administered medications for this visit.     Allergies-reviewed and updated No Known Allergies  Social History   Social History Narrative   Married 01/2014. 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       Hobbies: workout (aerobics and weights)      Objective:  BP 128/74   Pulse 68   Temp 98 F (36.7 C)   Ht _0  (1.854 m)   Wt 275 lb (124.7 kg)   SpO2 97%   BMI 36.28 kg/m  Gen: NAD, resting comfortably HEENT: Mucous membranes are moist. Oropharynx normal Neck: no thyromegaly CV: RRR no murmurs rubs or gallops Lungs: CTAB no crackles, wheeze, rhonchi Abdomen: soft/nontender/nondistended/normal bowel sounds. No rebound or guarding.  Ext: no edema Skin: warm, dry Neuro: grossly normal, moves all extremities, PERRLA    Assessment and Plan:  46 y.o. male presenting for annual physical.  Health Maintenance counseling: 1. Anticipatory guidance: Patient counseled regarding regular dental exams -q6 months, eye exams - needs to update,  avoiding smoking and second hand smoke , limiting alcohol to 2 beverages per day-  doesn't drink, no illicit drugs.   2. Risk factor reduction:  Advised patient of need for regular exercise  and diet rich and fruits and vegetables to reduce risk of heart attack and stroke.  Exercise- regular weight lifting- planning to boost cardio some (does do cardio if makes to the gym 3.5 on gym.  Diet/weight management-up 6 pounds from last year- knows he needs to improve diet.  Wt Readings from Last 3 Encounters:  06/21/22 275 lb (124.7 kg)  08/14/21 269 lb (122 kg)  06/22/21 269 lb 3.2 oz (122.1 kg)  3. Immunizations/screenings/ancillary studies- up to date, got flu shot Immunization History  Administered Date(s) Administered   Hepatitis B 09/30/2005, 07/02/2006, 04/08/2007   Influenza Whole 08/03/2011   Influenza,inj,Quad PF,6+ Mos 04/30/2014, 06/23/2015, 06/15/2017, 06/26/2018, 06/20/2020,  04/15/2021, 06/21/2022   MMR 03/07/2020, 04/15/2021   PFIZER(Purple Top)SARS-COV-2 Vaccination 11/14/2019, 12/03/2019   PPD Test 03/05/2020   Td 03/01/2006   Tdap 06/15/2017   Varicella 03/07/2020, 04/15/2021  4. Prostate cancer screening-  no family history, start at age 18outside of being on testosterone- get baseline PSA 5. Colon cancer screening - History adenomatous polyp October 02, 2020 with 1 year repeat with Dr. Loletha Carrow- he will call to schedule 6. Skin cancer screening/prevention- no dermatologist. advised regular sunscreen use. Denies worrisome, changing, or new skin lesions.  7. Testicular cancer screening- advised monthly self exams  8. STD screening- patient opts out- only active with wife 27. Smoking associated screening- former smoker- quit 15 years ago. At 16 needs AAA screen   Status of chronic or acute concerns   #Fatigue-feels like he is almost always tired but much improved on testosterone (see below)- he is not as confident that thyroid medicine is working  -Did have anemia years ago but not noted on most recent labs -Tested negative for sleep apnea 03/11/2022 home sleep test  -we do wonder about long haul covid as started after covid - a lot of work related stress as well-needs to work 8 more years  #hyperlipidemia S: Medication: None The 10-year ASCVD risk score (Arnett DK, et al., 2019) is: 4.3% Lab Results  Component Value Date   CHOL 228 (H) 07/03/2021   HDL 38.10 (L) 07/03/2021   LDLCALC 145 (H) 06/24/2020   LDLDIRECT 142.0 07/03/2021   TRIG 286.0 (H) 07/03/2021   CHOLHDL 6 07/03/2021   A/P: 10-year ASCVD risk not substantially elevated- will come back fasting to check  #Vitamin D deficiency S: Medication: Treated with high-dose vitamin D in the past-currently taking he thinks 1000 units a day but will check- misses doses  Last vitamin D Lab Results  Component Value Date   VD25OH 17.47 (L) 07/03/2021  A/P: hopefully improved- update vitmain D today.  Continue current meds for now    #hypothyroidism? S: compliant On thyroid medication- NP thyroid 60 mg Lab Results  Component Value Date   TSH 3.44 08/14/2021   A/P:retrialing right now 3 weeks  in- too early to check but he will follow up with [facility]  #Relative low testosterone/fatigue S: Being managed by [employer] -currently on testosterone injections as well as Arimidex  -We had tested him last year was low normal across-the-board but no true deficiencies -levels around 900- discussed literature recommends 500-600 A/P: testosterone levels being monitored by [facility] but we will check CBC and PSA for safety parameters  #LFT elevation in the past suspected fatty liver-thankfully has normalized    Latest Ref Rng & Units 07/03/2021    8:08 AM 06/24/2020    8:05 AM 06/26/2018    1:41 PM  Hepatic Function  Total Protein 6.0 -  8.3 g/dL 7.6  7.1  7.2   Albumin 3.5 - 5.2 g/dL 4.6   4.7   AST 0 - 37 U/L 27  38  35   ALT 0 - 53 U/L 30  42  28   Alk Phosphatase 39 - 117 U/L 77   74   Total Bilirubin 0.2 - 1.2 mg/dL 0.6  0.6  0.7     Recommended follow up: Return in about 1 year (around 06/22/2023) for physical or sooner if needed.Schedule b4 you leave.  Lab/Order associations: fasting   ICD-10-CM   1. Preventative health care  Z00.00 CBC with Differential/Platelet    Comprehensive metabolic panel    Lipid panel    VITAMIN D 25 Hydroxy (Vit-D Deficiency, Fractures)    PSA    Urinalysis, Routine w reflex microscopic    2. Need for immunization against influenza  Z23 Flu Vaccine QUAD 33moIM (Fluarix, Fluzone & Alfiuria Quad PF)    3. Hyperlipidemia, unspecified hyperlipidemia type  E78.5 CBC with Differential/Platelet    Comprehensive metabolic panel    Lipid panel    4. Vitamin D deficiency  E55.9 VITAMIN D 25 Hydroxy (Vit-D Deficiency, Fractures)    5. High risk medication use  Z79.899 PSA    6. Former smoker  Z87.891 Urinalysis, Routine w reflex microscopic     No orders  of the defined types were placed in this encounter.  Return precautions advised.   SGarret Reddish MD

## 2022-06-23 ENCOUNTER — Other Ambulatory Visit: Payer: Self-pay | Admitting: Family Medicine

## 2022-06-23 ENCOUNTER — Other Ambulatory Visit (INDEPENDENT_AMBULATORY_CARE_PROVIDER_SITE_OTHER): Payer: BC Managed Care – PPO

## 2022-06-23 DIAGNOSIS — E785 Hyperlipidemia, unspecified: Secondary | ICD-10-CM

## 2022-06-23 DIAGNOSIS — Z87891 Personal history of nicotine dependence: Secondary | ICD-10-CM | POA: Diagnosis not present

## 2022-06-23 DIAGNOSIS — Z79899 Other long term (current) drug therapy: Secondary | ICD-10-CM | POA: Diagnosis not present

## 2022-06-23 DIAGNOSIS — E559 Vitamin D deficiency, unspecified: Secondary | ICD-10-CM | POA: Diagnosis not present

## 2022-06-23 DIAGNOSIS — Z0000 Encounter for general adult medical examination without abnormal findings: Secondary | ICD-10-CM | POA: Diagnosis not present

## 2022-06-23 LAB — CBC WITH DIFFERENTIAL/PLATELET
Basophils Absolute: 0 10*3/uL (ref 0.0–0.1)
Basophils Relative: 0.5 % (ref 0.0–3.0)
Eosinophils Absolute: 0.3 10*3/uL (ref 0.0–0.7)
Eosinophils Relative: 2.7 % (ref 0.0–5.0)
HCT: 43.6 % (ref 39.0–52.0)
Hemoglobin: 15.4 g/dL (ref 13.0–17.0)
Lymphocytes Relative: 22 % (ref 12.0–46.0)
Lymphs Abs: 2.2 10*3/uL (ref 0.7–4.0)
MCHC: 35.2 g/dL (ref 30.0–36.0)
MCV: 85 fl (ref 78.0–100.0)
Monocytes Absolute: 0.7 10*3/uL (ref 0.1–1.0)
Monocytes Relative: 7.2 % (ref 3.0–12.0)
Neutro Abs: 6.7 10*3/uL (ref 1.4–7.7)
Neutrophils Relative %: 67.6 % (ref 43.0–77.0)
Platelets: 258 10*3/uL (ref 150.0–400.0)
RBC: 5.14 Mil/uL (ref 4.22–5.81)
RDW: 13.2 % (ref 11.5–15.5)
WBC: 9.9 10*3/uL (ref 4.0–10.5)

## 2022-06-23 LAB — LIPID PANEL
Cholesterol: 202 mg/dL — ABNORMAL HIGH (ref 0–200)
HDL: 36.5 mg/dL — ABNORMAL LOW (ref >39.00)
LDL Cholesterol: 130 mg/dL — ABNORMAL HIGH (ref 0–99)
NonHDL: 165.96
Total CHOL/HDL Ratio: 6
Triglycerides: 179 mg/dL — ABNORMAL HIGH (ref 0.0–149.0)
VLDL: 35.8 mg/dL (ref 0.0–40.0)

## 2022-06-23 LAB — URINALYSIS, ROUTINE W REFLEX MICROSCOPIC
Bilirubin Urine: NEGATIVE
Hgb urine dipstick: NEGATIVE
Ketones, ur: NEGATIVE
Leukocytes,Ua: NEGATIVE
Nitrite: NEGATIVE
RBC / HPF: NONE SEEN (ref 0–?)
Specific Gravity, Urine: 1.02 (ref 1.000–1.030)
Total Protein, Urine: NEGATIVE
Urine Glucose: NEGATIVE
Urobilinogen, UA: 0.2 (ref 0.0–1.0)
pH: 7 (ref 5.0–8.0)

## 2022-06-23 LAB — COMPREHENSIVE METABOLIC PANEL
ALT: 29 U/L (ref 0–53)
AST: 31 U/L (ref 0–37)
Albumin: 4.6 g/dL (ref 3.5–5.2)
Alkaline Phosphatase: 65 U/L (ref 39–117)
BUN: 15 mg/dL (ref 6–23)
CO2: 27 mEq/L (ref 19–32)
Calcium: 8.6 mg/dL (ref 8.4–10.5)
Chloride: 104 mEq/L (ref 96–112)
Creatinine, Ser: 1.2 mg/dL (ref 0.40–1.50)
GFR: 72.06 mL/min (ref >60.00)
Glucose, Bld: 100 mg/dL — ABNORMAL HIGH (ref 70–99)
Potassium: 4.1 mEq/L (ref 3.5–5.1)
Sodium: 138 mEq/L (ref 135–145)
Total Bilirubin: 0.8 mg/dL (ref 0.2–1.2)
Total Protein: 7.1 g/dL (ref 6.0–8.3)

## 2022-06-23 LAB — VITAMIN D 25 HYDROXY (VIT D DEFICIENCY, FRACTURES): VITD: 24.98 ng/mL — ABNORMAL LOW (ref 30.00–100.00)

## 2022-06-23 LAB — PSA: PSA: 0.4 ng/mL (ref 0.10–4.00)

## 2022-06-23 MED ORDER — VITAMIN D (ERGOCALCIFEROL) 1.25 MG (50000 UNIT) PO CAPS: 50000.0000 [IU] | ORAL_CAPSULE | ORAL | 1 refills | Status: AC

## 2022-08-19 ENCOUNTER — Encounter: Payer: Self-pay | Admitting: Family Medicine

## 2022-10-07 ENCOUNTER — Encounter: Payer: Self-pay | Admitting: Gastroenterology

## 2022-11-15 ENCOUNTER — Ambulatory Visit (AMBULATORY_SURGERY_CENTER): Payer: BC Managed Care – PPO

## 2022-11-15 ENCOUNTER — Encounter: Payer: Self-pay | Admitting: Gastroenterology

## 2022-11-15 VITALS — Ht 73.0 in | Wt 265.0 lb

## 2022-11-15 DIAGNOSIS — Z86010 Personal history of colonic polyps: Secondary | ICD-10-CM

## 2022-11-15 MED ORDER — NA SULFATE-K SULFATE-MG SULF 17.5-3.13-1.6 GM/177ML PO SOLN: 1.0000 | Freq: Once | ORAL | 0 refills | Status: AC

## 2022-11-15 NOTE — Progress Notes (Signed)
No egg or soy allergy known to patient  No issues known to pt with past sedation with any surgeries or procedures Patient denies ever being told they had issues or difficulty with intubation  No FH of Malignant Hyperthermia Pt is not on diet pills Pt is not on  home 02  Pt is not on blood thinners  Pt denies issues with constipation  No A fib or A flutter Have any cardiac testing pending--no Pt instructed to use Singlecare.com or GoodRx for a price reduction on prep   

## 2022-12-01 ENCOUNTER — Telehealth: Payer: Self-pay | Admitting: Gastroenterology

## 2022-12-01 NOTE — Telephone Encounter (Signed)
LM wit pt infomning him that a copy of the new instructioins where sent to his my chart account with instructions of how to find them on the site as well as the fact a hard copy will be sent to the address listed on his profile. Nothing except dates and times have been changed.

## 2022-12-01 NOTE — Telephone Encounter (Signed)
PT rescheduled colonoscopy and needs updated prep instructions. Please advise.

## 2022-12-01 NOTE — Telephone Encounter (Signed)
LM with pt  informing him that a copy of the new instructions with correct dates and times for procedure have been sent to his my chart account along with instructions on how to find them on my chart. He was also informed  that a hard copy  will be sent to the address listed on his profile.

## 2022-12-07 ENCOUNTER — Encounter: Payer: BC Managed Care – PPO | Admitting: Gastroenterology

## 2022-12-27 ENCOUNTER — Encounter: Payer: Self-pay | Admitting: Certified Registered Nurse Anesthetist

## 2022-12-30 ENCOUNTER — Encounter: Payer: Self-pay | Admitting: Gastroenterology

## 2022-12-30 ENCOUNTER — Ambulatory Visit (AMBULATORY_SURGERY_CENTER): Payer: BC Managed Care – PPO | Admitting: Gastroenterology

## 2022-12-30 VITALS — BP 121/88 | HR 65 | Temp 98.1°F | Resp 17 | Ht 73.0 in | Wt 265.0 lb

## 2022-12-30 DIAGNOSIS — Z09 Encounter for follow-up examination after completed treatment for conditions other than malignant neoplasm: Secondary | ICD-10-CM

## 2022-12-30 DIAGNOSIS — Z86010 Personal history of colonic polyps: Secondary | ICD-10-CM | POA: Diagnosis not present

## 2022-12-30 MED ORDER — SODIUM CHLORIDE 0.9 % IV SOLN: 500.0000 mL | INTRAVENOUS | Status: DC

## 2022-12-30 NOTE — Progress Notes (Signed)
Pt's states no medical or surgical changes since previsit or office visit. 

## 2022-12-30 NOTE — Patient Instructions (Signed)
Please read handouts provided. Continue present medications. Repeat colonoscopy in 4 years for screening.   YOU HAD AN ENDOSCOPIC PROCEDURE TODAY AT THE [facility]:   Refer to the procedure report that was given to you for any specific questions about what was found during the examination.  If the procedure report does not answer your questions, please call your gastroenterologist to clarify.  If you requested that your care partner not be given the details of your procedure findings, then the procedure report has been included in a sealed envelope for you to review at your convenience later.  YOU SHOULD EXPECT: Some feelings of bloating in the abdomen. Passage of more gas than usual.  Walking can help get rid of the air that was put into your GI tract during the procedure and reduce the bloating. If you had a lower endoscopy (such as a colonoscopy or flexible sigmoidoscopy) you may notice spotting of blood in your stool or on the toilet paper. If you underwent a bowel prep for your procedure, you may not have a normal bowel movement for a few days.  Please Note:  You might notice some irritation and congestion in your nose or some drainage.  This is from the oxygen used during your procedure.  There is no need for concern and it should clear up in a day or so.  SYMPTOMS TO REPORT IMMEDIATELY:  Following lower endoscopy (colonoscopy or flexible sigmoidoscopy):  Excessive amounts of blood in the stool  Significant tenderness or worsening of abdominal pains  Swelling of the abdomen that is new, acute  Fever of 100F or higher.  For urgent or emergent issues, a gastroenterologist can be reached at any hour by calling [phone]. Do not use MyChart messaging for urgent concerns.    DIET:  We do recommend a small meal at first, but then you may proceed to your regular diet.  Drink plenty of fluids but you should avoid alcoholic beverages for 24 hours.  ACTIVITY:  You should plan  to take it easy for the rest of today and you should NOT DRIVE or use heavy machinery until tomorrow (because of the sedation medicines used during the test).    FOLLOW UP: Our staff will call the number listed on your records the next business day following your procedure.  We will call around 7:15- 8:00 am to check on you and address any questions or concerns that you may have regarding the information given to you following your procedure. If we do not reach you, we will leave a message.     If any biopsies were taken you will be contacted by phone or by letter within the next 1-3 weeks.  Please call us at [phone] if you have not heard about the biopsies in 3 weeks.    SIGNATURES/CONFIDENTIALITY: You and/or your care partner have signed paperwork which will be entered into your electronic medical record.  These signatures attest to the fact that that the information above on your After Visit Summary has been reviewed and is understood.  Full responsibility of the confidentiality of this discharge information lies with you and/or your care-partner.

## 2022-12-30 NOTE — Op Note (Signed)
[facility] Patient Name: Thomas Gilmore Procedure Date: 12/30/2022 2:25 PM MRN: [id number] Endoscopist: Sherilyn Cooter L. Myrtie Neither , MD, [id number] Age: 47 Referring MD:  Date of Birth: 1976/04/06 Gender: Male Account #: [id number] Procedure:                Colonoscopy Indications:              Surveillance: History of numerous (> 10) adenomas                            on last colonoscopy (< 3 yrs)                           10 TA and SSP (mostly TA, one > 10mm) on first                            screening colonoscopy March 2022 Medicines:                Monitored Anesthesia Care Procedure:                Pre-Anesthesia Assessment:                           - Prior to the procedure, a History and Physical                            was performed, and patient medications and                            allergies were reviewed. The patient's tolerance of                            previous anesthesia was also reviewed. The risks                            and benefits of the procedure and the sedation                            options and risks were discussed with the patient.                            All questions were answered, and informed consent                            was obtained. Prior Anticoagulants: The patient has                            taken no anticoagulant or antiplatelet agents. ASA                            Grade Assessment: I - A normal, healthy patient.                            After reviewing the risks and benefits, the patient  was deemed in satisfactory condition to undergo the                            procedure.                           After obtaining informed consent, the colonoscope                            was passed under direct vision. Throughout the                            procedure, the patient's blood pressure, pulse, and                            oxygen saturations were monitored continuously. The                             Olympus SN [id number] was introduced through the anus                            and advanced to the the cecum, identified by                            appendiceal orifice and ileocecal valve. The                            colonoscopy was performed without difficulty. The                            patient tolerated the procedure well. The quality                            of the bowel preparation was excellent. The                            ileocecal valve, appendiceal orifice, and rectum                            were photographed. Scope In: 2:29:47 PM Scope Out: 2:41:28 PM Scope Withdrawal Time: 0 hours 9 minutes 10 seconds  Total Procedure Duration: 0 hours 11 minutes 41 seconds  Findings:                 The perianal and digital rectal examinations were                            normal.                           Repeat examination of right colon under NBI                            performed.  There is no endoscopic evidence of polyps in the                            entire colon.                           Internal hemorrhoids were found.                           The exam was otherwise without abnormality on                            direct and retroflexion views. Complications:            No immediate complications. Estimated Blood Loss:     Estimated blood loss: none. Impression:               - Internal hemorrhoids.                           - The examination was otherwise normal on direct                            and retroflexion views.                           - No specimens collected. Recommendation:           - Patient has a contact number available for                            emergencies. The signs and symptoms of potential                            delayed complications were discussed with the                            patient. Return to normal activities tomorrow.                            Written discharge  instructions were provided to the                            patient.                           - Resume previous diet.                           - Continue present medications.                           - Repeat colonoscopy in 4 years for surveillance. Anne Boltz L. Myrtie Neither, MD 12/30/2022 2:49:14 PM This report has been signed electronically.

## 2022-12-30 NOTE — Progress Notes (Signed)
History and Physical:  This patient presents for endoscopic testing for: Encounter Diagnosis  Name Primary?   Personal history of colonic polyps Yes    TA & SSP (mostly TA) x 10 on first screening colonoscopy March 2022  Patient is otherwise without complaints or active issues today.   Past Medical History: Past Medical History:  Diagnosis Date   Anemia 04/30/2014   Hyperlipidemia    Inguinal hernia    Rotator cuff injury    Syncope    after hernia surgery   Umbilical hernia      Past Surgical History: Past Surgical History:  Procedure Laterality Date   APPENDECTOMY  12/1993   CHOLECYSTECTOMY  12/16/2011   Procedure: LAPAROSCOPIC CHOLECYSTECTOMY WITH INTRAOPERATIVE CHOLANGIOGRAM;  Surgeon: Ernestene Mention, MD;  Location: [facility];  Service: General;  Laterality: N/A;   HERNIA REPAIR  12/06/2011   Right Inguinal   REFRACTIVE SURGERY Bilateral    VASECTOMY  07/2014    Allergies: No Known Allergies  Outpatient Meds: Current Outpatient Medications  Medication Sig Dispense Refill   anastrozole (ARIMIDEX) 1 MG tablet Take 1 mg by mouth once a week.     testosterone cypionate (DEPOTESTOSTERONE CYPIONATE) 200 MG/ML injection Inject 200 mg into the muscle once a week.     NP THYROID 60 MG tablet Take 60 mg by mouth daily.     Vitamin D, Ergocalciferol, (DRISDOL) 1.25 MG (50000 UNIT) CAPS capsule Take 1 capsule (50,000 Units total) by mouth every 7 (seven) days. 13 capsule 1   Current Facility-Administered Medications  Medication Dose Route Frequency Provider Last Rate Last Admin   0.9 %  sodium chloride infusion  500 mL Intravenous Continuous Charlie Pitter III, MD          ___________________________________________________________________ Objective   Exam:  BP 139/68   Pulse 61   Temp 98.1 F (36.7 C)   Ht 6\' 1"  (1.854 m)   Wt 265 lb (120.2 kg)   SpO2 99%   BMI 34.96 kg/m   CV: regular , S1/S2 Resp: clear to auscultation bilaterally, normal RR and effort  noted GI: soft, no tenderness, with active bowel sounds.   Assessment: Encounter Diagnosis  Name Primary?   Personal history of colonic polyps Yes     Plan: Colonoscopy   The benefits and risks of the planned procedure were described in detail with the patient or (when appropriate) their health care proxy.  Risks were outlined as including, but not limited to, bleeding, infection, perforation, adverse medication reaction leading to cardiac or pulmonary decompensation, pancreatitis (if ERCP).  The limitation of incomplete mucosal visualization was also discussed.  No guarantees or warranties were given.  The patient is appropriate for an endoscopic procedure in the ambulatory setting.   - Amada Jupiter, MD

## 2022-12-31 ENCOUNTER — Telehealth: Payer: Self-pay

## 2022-12-31 NOTE — Telephone Encounter (Signed)
  Follow up Call-     12/30/2022    1:52 PM 10/02/2020   10:21 AM  Call back number  Post procedure Call Back phone  # [phone]  Permission to leave phone message Yes Yes     Patient questions:  Do you have a fever, pain , or abdominal swelling? No. Pain Score  0 *  Have you tolerated food without any problems? Yes.    Have you been able to return to your normal activities? Yes.    Do you have any questions about your discharge instructions: Diet   No. Medications  No. Follow up visit  No.  Do you have questions or concerns about your Care? No.  Actions: * If pain score is 4 or above: No action needed, pain <4.
